# Patient Record
Sex: Female | Born: 1955 | Race: Black or African American | Hispanic: No | Smoking: Never smoker
Health system: Southern US, Community
[De-identification: ages and names within clinical notes are randomized; demographics above are authoritative.]

## PROBLEM LIST (undated history)

## (undated) DIAGNOSIS — E0500 Thyrotoxicosis with diffuse goiter without thyrotoxic crisis or storm: Secondary | ICD-10-CM

## (undated) DIAGNOSIS — D509 Iron deficiency anemia, unspecified: Secondary | ICD-10-CM

## (undated) DIAGNOSIS — D696 Thrombocytopenia, unspecified: Secondary | ICD-10-CM

## (undated) DIAGNOSIS — F419 Anxiety disorder, unspecified: Secondary | ICD-10-CM

## (undated) HISTORY — DX: Anxiety disorder, unspecified: F41.9

---

## 1990-01-08 HISTORY — PX: TUBAL LIGATION: SHX77

## 1999-08-23 ENCOUNTER — Emergency Department (HOSPITAL_COMMUNITY): Admission: EM | Admit: 1999-08-23 | Discharge: 1999-08-23 | Payer: Self-pay | Admitting: Emergency Medicine

## 2000-01-25 ENCOUNTER — Emergency Department (HOSPITAL_COMMUNITY): Admission: EM | Admit: 2000-01-25 | Discharge: 2000-01-25 | Payer: Self-pay | Admitting: Emergency Medicine

## 2001-01-08 HISTORY — PX: ENDOMETRIAL BIOPSY: SHX622

## 2001-07-28 ENCOUNTER — Encounter: Payer: Self-pay | Admitting: Obstetrics and Gynecology

## 2001-07-28 ENCOUNTER — Inpatient Hospital Stay (HOSPITAL_COMMUNITY): Admission: AD | Admit: 2001-07-28 | Discharge: 2001-07-28 | Payer: Self-pay | Admitting: *Deleted

## 2001-08-05 ENCOUNTER — Encounter (INDEPENDENT_AMBULATORY_CARE_PROVIDER_SITE_OTHER): Payer: Self-pay | Admitting: Specialist

## 2001-08-05 ENCOUNTER — Other Ambulatory Visit: Admission: RE | Admit: 2001-08-05 | Discharge: 2001-08-05 | Payer: Self-pay | Admitting: *Deleted

## 2001-08-05 ENCOUNTER — Encounter: Admission: RE | Admit: 2001-08-05 | Discharge: 2001-08-05 | Payer: Self-pay | Admitting: *Deleted

## 2001-11-19 ENCOUNTER — Emergency Department (HOSPITAL_COMMUNITY): Admission: EM | Admit: 2001-11-19 | Discharge: 2001-11-19 | Payer: Self-pay

## 2002-06-29 ENCOUNTER — Inpatient Hospital Stay (HOSPITAL_COMMUNITY): Admission: AD | Admit: 2002-06-29 | Discharge: 2002-06-29 | Payer: Self-pay | Admitting: Obstetrics & Gynecology

## 2006-02-23 ENCOUNTER — Emergency Department (HOSPITAL_COMMUNITY): Admission: EM | Admit: 2006-02-23 | Discharge: 2006-02-23 | Payer: Self-pay | Admitting: Emergency Medicine

## 2006-08-11 ENCOUNTER — Emergency Department (HOSPITAL_COMMUNITY): Admission: EM | Admit: 2006-08-11 | Discharge: 2006-08-11 | Payer: Self-pay | Admitting: Emergency Medicine

## 2006-08-11 IMAGING — CR DG CHEST 2V
2 series · 2 of 2 positions shown · non-contrast
Comparison: None.

CLINICAL DATA: Chest pain. Shortness of breath.

CHEST - 2 VIEW  [DATE]:

[w chest pa]
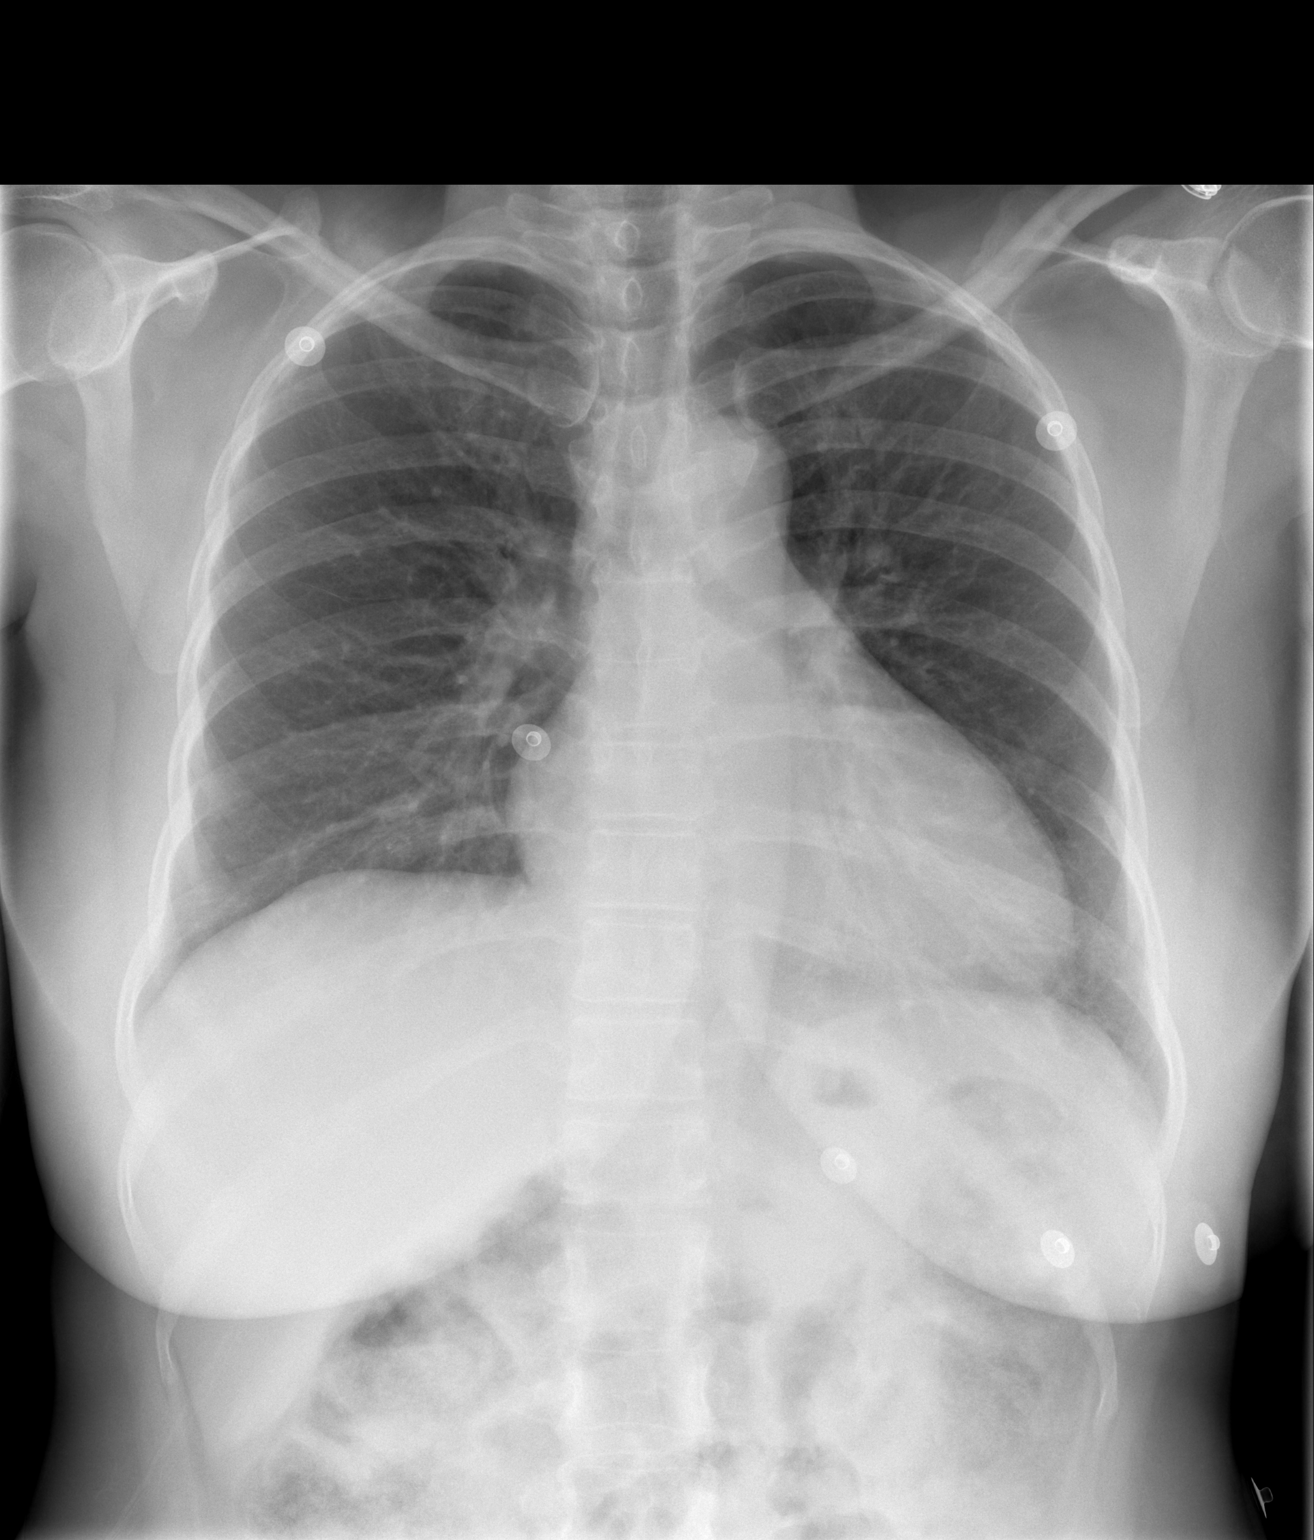

[w chest lat]
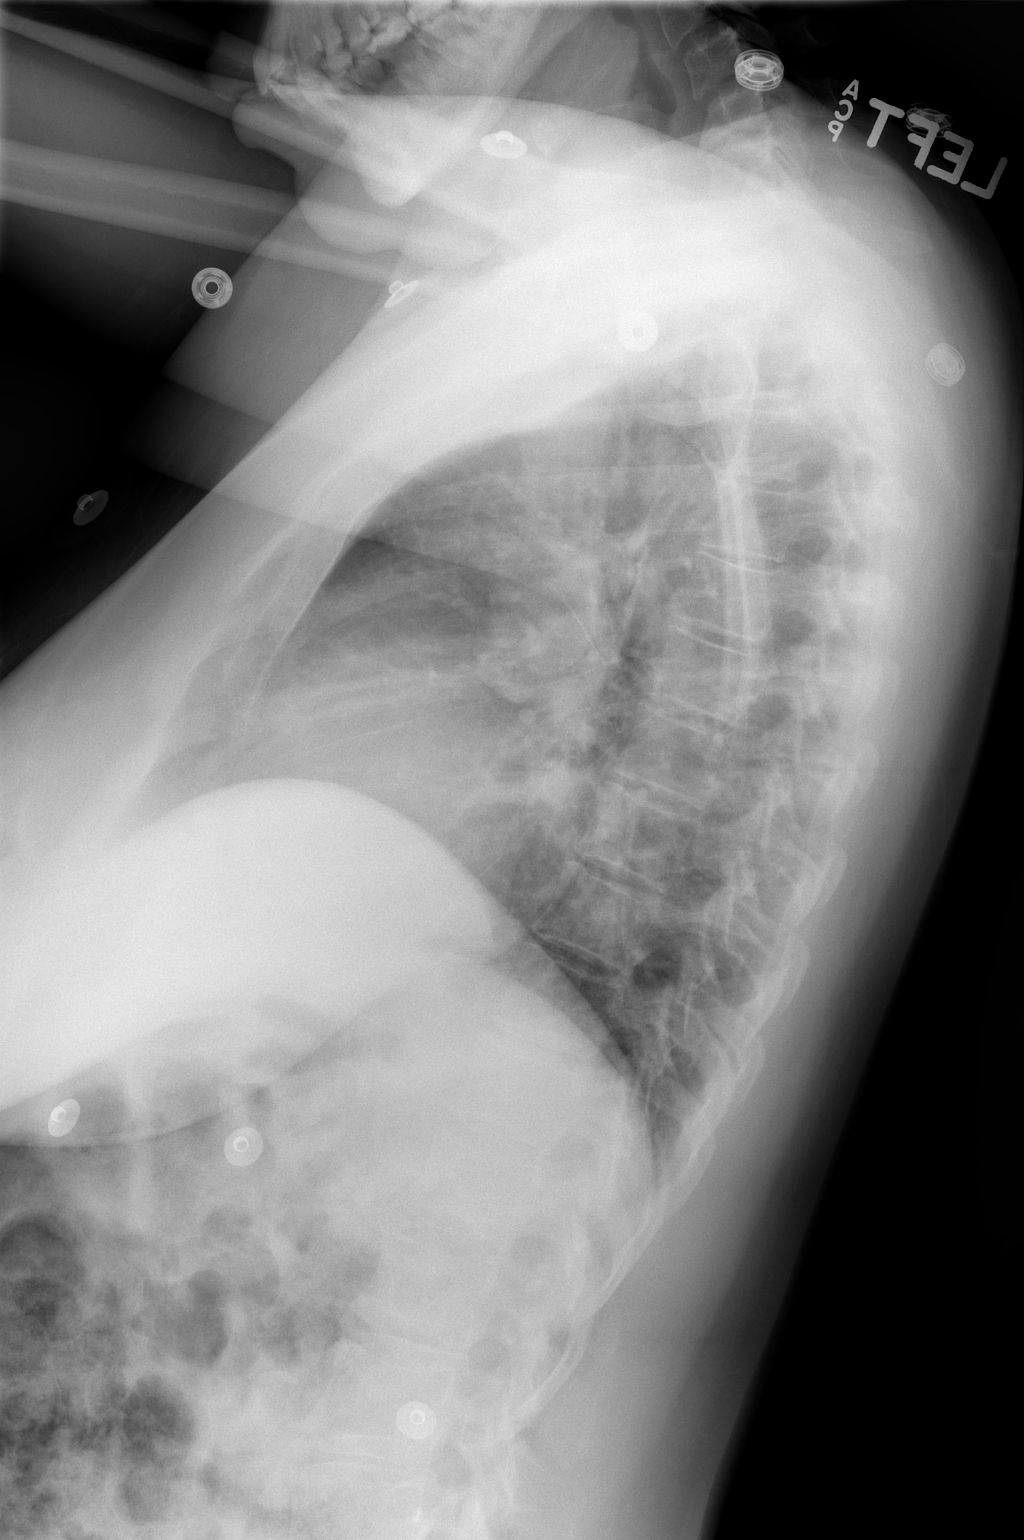

[2 of 2 positions shown; findings below may reference images not displayed]

FINDINGS: Heart mildly enlarged. Hilar and mediastinal contours otherwise
unremarkable. Pulmonary vascularity normal without evidence of pulmonary edema.
Lungs clear. No pleural effusions. Visualized bony thorax intact.
IMPRESSION: Mild cardiomegaly. No acute cardiopulmonary disease.

## 2006-11-23 ENCOUNTER — Emergency Department (HOSPITAL_COMMUNITY): Admission: EM | Admit: 2006-11-23 | Discharge: 2006-11-23 | Payer: Self-pay | Admitting: Emergency Medicine

## 2006-11-25 ENCOUNTER — Emergency Department (HOSPITAL_COMMUNITY): Admission: EM | Admit: 2006-11-25 | Discharge: 2006-11-25 | Payer: Self-pay | Admitting: Emergency Medicine

## 2006-12-07 ENCOUNTER — Emergency Department (HOSPITAL_COMMUNITY): Admission: EM | Admit: 2006-12-07 | Discharge: 2006-12-07 | Payer: Self-pay | Admitting: Emergency Medicine

## 2010-10-17 LAB — BASIC METABOLIC PANEL
BUN: 17
CO2: 25
Calcium: 8.8
Chloride: 110
Creatinine, Ser: 0.7
GFR calc Af Amer: >60
GFR calc non Af Amer: >60
Glucose, Bld: 83
Potassium: 3.7
Sodium: 141

## 2010-10-17 LAB — CBC
HCT: 37.3
Hemoglobin: 12.5
MCHC: 33.5
MCV: 79.8
Platelets: 164
RBC: 4.67
RDW: 15.1
WBC: 5.8

## 2010-10-17 LAB — DIFFERENTIAL
Basophils Absolute: 0
Eosinophils Relative: 5
Lymphocytes Relative: 23

## 2010-10-23 LAB — DIFFERENTIAL
Lymphocytes Relative: 40
Lymphs Abs: 1.7
Neutro Abs: 2.1
Neutrophils Relative %: 51

## 2010-10-23 LAB — CBC
HCT: 38.3
Platelets: 162
WBC: 4.2

## 2010-10-23 LAB — I-STAT 8, (EC8 V) (CONVERTED LAB)
BUN: 9
Bicarbonate: 30.1 — ABNORMAL HIGH
Glucose, Bld: 95
pCO2, Ven: 54 — ABNORMAL HIGH

## 2010-10-23 LAB — POCT CARDIAC MARKERS
CKMB, poc: 1.5
Myoglobin, poc: 54.7
Operator id: 133351
Troponin i, poc: <0.05

## 2010-10-23 LAB — D-DIMER, QUANTITATIVE: D-Dimer, Quant: 2.02 — ABNORMAL HIGH

## 2011-06-09 DIAGNOSIS — D696 Thrombocytopenia, unspecified: Secondary | ICD-10-CM

## 2011-06-09 HISTORY — DX: Thrombocytopenia, unspecified: D69.6

## 2011-09-09 DIAGNOSIS — D509 Iron deficiency anemia, unspecified: Secondary | ICD-10-CM

## 2011-09-09 HISTORY — DX: Iron deficiency anemia, unspecified: D50.9

## 2011-09-17 ENCOUNTER — Emergency Department (HOSPITAL_COMMUNITY): Payer: Self-pay

## 2011-09-17 ENCOUNTER — Encounter (HOSPITAL_COMMUNITY): Payer: Self-pay | Admitting: *Deleted

## 2011-09-17 DIAGNOSIS — N390 Urinary tract infection, site not specified: Secondary | ICD-10-CM | POA: Insufficient documentation

## 2011-09-17 DIAGNOSIS — R42 Dizziness and giddiness: Secondary | ICD-10-CM | POA: Insufficient documentation

## 2011-09-17 DIAGNOSIS — IMO0001 Reserved for inherently not codable concepts without codable children: Secondary | ICD-10-CM | POA: Insufficient documentation

## 2011-09-17 DIAGNOSIS — R21 Rash and other nonspecific skin eruption: Secondary | ICD-10-CM | POA: Insufficient documentation

## 2011-09-17 LAB — CBC WITH DIFFERENTIAL/PLATELET
Basophils Absolute: 0 10*3/uL (ref 0.0–0.1)
Eosinophils Absolute: 0 10*3/uL (ref 0.0–0.7)
Lymphs Abs: 2 10*3/uL (ref 0.7–4.0)
MCHC: 32.6 g/dL (ref 30.0–36.0)
MCV: 75.9 fL — ABNORMAL LOW (ref 78.0–100.0)
Monocytes Relative: 8 % (ref 3–12)
Neutro Abs: 4 10*3/uL (ref 1.7–7.7)
Platelets: ADEQUATE 10*3/uL (ref 150–400)
RDW: 13.7 % (ref 11.5–15.5)
WBC: 6.5 10*3/uL (ref 4.0–10.5)

## 2011-09-17 LAB — COMPREHENSIVE METABOLIC PANEL
ALT: 38 U/L — ABNORMAL HIGH (ref 0–35)
Albumin: 3.4 g/dL — ABNORMAL LOW (ref 3.5–5.2)
Alkaline Phosphatase: 139 U/L — ABNORMAL HIGH (ref 39–117)
BUN: 15 mg/dL (ref 6–23)
Chloride: 101 mEq/L (ref 96–112)
GFR calc Af Amer: >90 mL/min (ref >90)
Glucose, Bld: 108 mg/dL — ABNORMAL HIGH (ref 70–99)
Potassium: 3.5 mEq/L (ref 3.5–5.1)
Sodium: 137 mEq/L (ref 135–145)
Total Bilirubin: 0.3 mg/dL (ref 0.3–1.2)

## 2011-09-17 NOTE — ED Notes (Addendum)
C/o CP onset 1 week ago, mid chest, radiates to R breast and RUQ/flank, comes and goes, also reports sob, nausea, dizziness, weakness and intermitant weakness.Jamal Maes zantac on Sunday w/o relief. Also body aches.

## 2011-09-18 ENCOUNTER — Emergency Department (HOSPITAL_COMMUNITY)
Admission: EM | Admit: 2011-09-18 | Discharge: 2011-09-18 | Disposition: A | Discharge status: Home / Self Care | Payer: Self-pay | Attending: Emergency Medicine | Admitting: Emergency Medicine

## 2011-09-18 DIAGNOSIS — N390 Urinary tract infection, site not specified: Secondary | ICD-10-CM

## 2011-09-18 DIAGNOSIS — M791 Myalgia, unspecified site: Secondary | ICD-10-CM

## 2011-09-18 DIAGNOSIS — R21 Rash and other nonspecific skin eruption: Secondary | ICD-10-CM

## 2011-09-18 LAB — URINALYSIS, ROUTINE W REFLEX MICROSCOPIC
Bilirubin Urine: NEGATIVE
Glucose, UA: NEGATIVE mg/dL
Ketones, ur: NEGATIVE mg/dL
Protein, ur: NEGATIVE mg/dL
pH: 5.5 (ref 5.0–8.0)

## 2011-09-18 LAB — URINE MICROSCOPIC-ADD ON

## 2011-09-18 MED ORDER — SULFAMETHOXAZOLE-TRIMETHOPRIM 800-160 MG PO TABS: 1.0000 | ORAL_TABLET | Freq: Two times a day (BID) | ORAL | Status: AC

## 2011-09-18 MED ORDER — SODIUM CHLORIDE 0.9 % IV BOLUS (SEPSIS)
1000.0000 mL | Freq: Once | INTRAVENOUS | Status: AC
  Administered 2011-09-18: 1000 mL via INTRAVENOUS

## 2011-09-18 MED ORDER — PREDNISONE 20 MG PO TABS: 40.0000 mg | ORAL_TABLET | Freq: Every day | ORAL | Status: AC

## 2011-09-18 MED ORDER — KETOROLAC TROMETHAMINE 30 MG/ML IJ SOLN
INTRAMUSCULAR | Status: AC
  Filled 2011-09-18: qty 1

## 2011-09-18 MED ORDER — METHYLPREDNISOLONE SODIUM SUCC 125 MG IJ SOLR
125.0000 mg | Freq: Once | INTRAMUSCULAR | Status: AC
  Administered 2011-09-18: 125 mg via INTRAVENOUS
  Filled 2011-09-18: qty 2

## 2011-09-18 MED ORDER — NAPROXEN 500 MG PO TABS: 500.0000 mg | ORAL_TABLET | Freq: Two times a day (BID) | ORAL | Status: DC

## 2011-09-18 MED ORDER — KETOROLAC TROMETHAMINE 30 MG/ML IJ SOLN
30.0000 mg | Freq: Once | INTRAMUSCULAR | Status: AC
  Administered 2011-09-18: 30 mg via INTRAVENOUS
  Filled 2011-09-18: qty 1

## 2011-09-18 NOTE — ED Notes (Signed)
Redness and itching improved to rash.  Pt states toradol improve chest pain and reduce her pain to 4/10.

## 2011-09-18 NOTE — ED Provider Notes (Signed)
History     CSN: [id number]  Arrival date & time 09/17/11  1906   First MD Initiated Contact with Patient 09/18/11 0102      Chief Complaint  Patient presents with  . Chest Pain  . Dizziness    (Consider location/radiation/quality/duration/timing/severity/associated sxs/prior treatment) HPI Comments: 56 year old female with no significant past medical history presents with approximately one week of symptoms. The symptoms are multiple and are as follows  #1 rash approximately one week ago the patient developed a bilateral antecubital papular eruptions along with a papular eruption around the neck and up the hairline posteriorly. This is papular and has responded minimally to hydrocortisone. There is no petechiae or purpura, no lesions in her mouth and she has not recently started any new medications.  #2 headache this is bilateral temples, she does have a history of headaches similar to this, she has no changes in her vision, no numbness weakness or ataxia. This has been present for 5 days, Advil makes the headache go away but it does come back later.  #3 back pain and chest pain, patient states that she over the last several months has had an intermittent shooting pain that goes from her right upper back to her right chest and then totally resolved, lasts only seconds and is not related with shortness of breath coughing fevers chills nausea or vomiting. She notes that over the last several days she's developed severe generalized fatigue and bilateral sore legs. She has difficulty walking or even standing because of the pain in her muscles. She denies any swelling of her joints, changes in vision and has no focal weakness. She does not have a family Dr.  Patient is a 56 y.o. female presenting with chest pain. The history is provided by the patient and the spouse.  Chest Pain     History reviewed. No pertinent past medical history.  History reviewed. No pertinent past surgical  history.  No family history on file.  History  Substance Use Topics  . Smoking status: Never Smoker   . Smokeless tobacco: Not on file  . Alcohol Use: No    OB History    Grav Para Term Preterm Abortions TAB SAB Ect Mult Living                  Review of Systems  Cardiovascular: Positive for chest pain.  All other systems reviewed and are negative.    Allergies  Review of patient's allergies indicates no known allergies.  Home Medications   Current Outpatient Rx  Name Route Sig Dispense Refill  . NAPROXEN 500 MG PO TABS Oral Take 1 tablet (500 mg total) by mouth 2 (two) times daily with a meal. 30 tablet 0  . PREDNISONE 20 MG PO TABS Oral Take 2 tablets (40 mg total) by mouth daily. Take 40 mg by mouth daily for 3 days, then 20mg  by mouth daily for 3 days, then 10mg  daily for 3 days 12 tablet 0  . SULFAMETHOXAZOLE-TRIMETHOPRIM 800-160 MG PO TABS Oral Take 1 tablet by mouth every 12 (twelve) hours. 20 tablet 0    BP 112/69  Pulse 87  Temp 98.6 F (37 C) (Oral)  Resp 18  SpO2 97%  Physical Exam  Nursing note and vitals reviewed. Constitutional: She appears well-developed and well-nourished. No distress.  HENT:  Head: Normocephalic and atraumatic.  Mouth/Throat: Oropharynx is clear and moist. No oropharyngeal exudate.       No oropharyngeal lesions  Eyes: Conjunctivae and EOM are  normal. Pupils are equal, round, and reactive to light. Right eye exhibits no discharge. Left eye exhibits no discharge. No scleral icterus.  Neck: Normal range of motion. Neck supple. No JVD present. No thyromegaly present.  Cardiovascular: Normal rate, regular rhythm, normal heart sounds and intact distal pulses.  Exam reveals no gallop and no friction rub.   No murmur heard. Pulmonary/Chest: Effort normal and breath sounds normal. No respiratory distress. She has no wheezes. She has no rales.  Abdominal: Soft. Bowel sounds are normal. She exhibits no distension and no mass. There is  tenderness ( Mild suprapubic tenderness, no guarding or masses, no pain at McBurney's point).  Musculoskeletal: Normal range of motion. She exhibits tenderness ( Diffuse muscular tenderness of the lower extremities including quadriceps, hamstrings, calf muscles.). She exhibits no edema.       No effusions of the joints, normal range of motion of all major joints  Lymphadenopathy:    She has no cervical adenopathy.  Neurological: She is alert. Coordination normal.  Skin: Skin is warm and dry. Rash ( Papular eruption of the bilateral antecubital fossa as well as around the neck and up the scalp posteriorly to the hairline.) noted. No erythema.       No petechiae or purpura  Psychiatric: She has a normal mood and affect. Her behavior is normal.    ED Course  Procedures (including critical care time)  Labs Reviewed  COMPREHENSIVE METABOLIC PANEL - Abnormal; Notable for the following:    Glucose, Bld 108 (*)     Albumin 3.4 (*)     ALT 38 (*)     Alkaline Phosphatase 139 (*)     All other components within normal limits  CBC WITH DIFFERENTIAL - Abnormal; Notable for the following:    MCV 75.9 (*)     MCH 24.7 (*)     All other components within normal limits  SEDIMENTATION RATE - Abnormal; Notable for the following:    Sed Rate 45 (*)     All other components within normal limits  URINALYSIS, ROUTINE W REFLEX MICROSCOPIC - Abnormal; Notable for the following:    APPearance CLOUDY (*)     Hgb urine dipstick TRACE (*)     Nitrite POSITIVE (*)     Leukocytes, UA SMALL (*)     All other components within normal limits  URINE MICROSCOPIC-ADD ON - Abnormal; Notable for the following:    Squamous Epithelial / LPF FEW (*)     Bacteria, UA MANY (*)     All other components within normal limits  LIPASE, BLOOD  CK  URINE CULTURE   Dg Chest 2 View  09/17/2011  *RADIOLOGY REPORT*  Clinical Data: Chest pain and dizziness.  CHEST - 2 VIEW  Comparison: Chest radiographs and chest CTA dated  08/11/2006.  Findings: Mildly progressive enlargement of the cardiac silhouette. Mild increase in prominence of the interstitial markings.  The vascularity remains within normal limits.  No pleural fluid. Unremarkable bones.  IMPRESSION: Mildly progressive cardiomegaly with interval mild chronic interstitial lung disease.   Original Report Authenticated By: Darrol Angel, M.D.      1. Myalgia   2. UTI (lower urinary tract infection)   3. Rash       MDM  The patient has generalized symptoms, possibly consistent with an arthritic a, EKG shows a sinus tachycardia without any significant ischemic or abnormal findings other than rate. There is no old EKG with which to compare and the patient has  no cardiac history. She is not currently having any chest pain or back pain. We'll pursue workup for her rash and muscular pain with sedimentation rate, CK, urinalysis for the lower abdominal pain, steroids and pain medications given, reevaluate.  ED ECG REPORT  I personally interpreted this EKG   Date: 09/18/2011   Rate: 114  Rhythm: sinus tachycardia  QRS Axis: normal  Intervals: normal  ST/T Wave abnormalities: nonspecific T wave changes  Conduction Disutrbances:none  Narrative Interpretation:   Old EKG Reviewed: none available   The patient states she feels much much better, she has a negative creatine kinase, elevated sedimentation rate consistent with an inflammatory process and a urinary tract infection. She has been given Toradol, Solu-Medrol and normal saline and has had significant improvement in her symptoms and that her pain is now 3/10. At this time she does appear stable for discharge, her urinalysis does reveal urinary infection, Bactrim has been prescribed and a urine culture ordered given the fact we're putting her on prednisone.  Vida Roller, MD 09/18/11 [id number]

## 2011-09-19 LAB — URINE CULTURE

## 2012-06-29 ENCOUNTER — Emergency Department (HOSPITAL_COMMUNITY)
Admission: EM | Admit: 2012-06-29 | Discharge: 2012-06-29 | Disposition: A | Discharge status: Home / Self Care | Payer: Self-pay | Attending: Emergency Medicine | Admitting: Emergency Medicine

## 2012-06-29 ENCOUNTER — Encounter (HOSPITAL_COMMUNITY): Payer: Self-pay | Admitting: Family Medicine

## 2012-06-29 ENCOUNTER — Emergency Department (HOSPITAL_COMMUNITY): Payer: Self-pay

## 2012-06-29 DIAGNOSIS — R0602 Shortness of breath: Secondary | ICD-10-CM | POA: Insufficient documentation

## 2012-06-29 DIAGNOSIS — R079 Chest pain, unspecified: Secondary | ICD-10-CM

## 2012-06-29 DIAGNOSIS — R05 Cough: Secondary | ICD-10-CM | POA: Insufficient documentation

## 2012-06-29 DIAGNOSIS — R059 Cough, unspecified: Secondary | ICD-10-CM | POA: Insufficient documentation

## 2012-06-29 LAB — POCT I-STAT TROPONIN I
Troponin i, poc: 0.01 ng/mL (ref 0.00–0.08)
Troponin i, poc: 0 ng/mL (ref 0.00–0.08)

## 2012-06-29 LAB — CBC WITH DIFFERENTIAL/PLATELET
Basophils Absolute: 0 10*3/uL (ref 0.0–0.1)
Hemoglobin: 11.3 g/dL — ABNORMAL LOW (ref 12.0–15.0)
Lymphocytes Relative: 38 % (ref 12–46)
MCH: 23.5 pg — ABNORMAL LOW (ref 26.0–34.0)
MCHC: 32.8 g/dL (ref 30.0–36.0)
MCV: 71.9 fL — ABNORMAL LOW (ref 78.0–100.0)
Monocytes Absolute: 0.5 10*3/uL (ref 0.1–1.0)
Neutro Abs: 2.3 10*3/uL (ref 1.7–7.7)
Neutrophils Relative %: 51 % (ref 43–77)
RBC: 4.8 MIL/uL (ref 3.87–5.11)

## 2012-06-29 LAB — BASIC METABOLIC PANEL
CO2: 26 mEq/L (ref 19–32)
Chloride: 106 mEq/L (ref 96–112)
Sodium: 140 mEq/L (ref 135–145)

## 2012-06-29 MED ORDER — ASPIRIN 81 MG PO CHEW
324.0000 mg | CHEWABLE_TABLET | Freq: Once | ORAL | Status: AC
  Administered 2012-06-29: 324 mg via ORAL
  Filled 2012-06-29: qty 4

## 2012-06-29 NOTE — ED Notes (Signed)
Per pt chest pain while at work today. sts more when breathing and moving sts also shoulder pain. sts she has had productive cough. sts weight loss in the past few months. Denies injury or heavy lifting.

## 2012-06-29 NOTE — ED Notes (Signed)
Patient transported to X-ray 

## 2012-06-29 NOTE — ED Provider Notes (Signed)
History     CSN: [id number]  Arrival date & time 06/29/12  1149   First MD Initiated Contact with Patient 06/29/12 1158      Chief Complaint  Patient presents with  . Chest Pain    (Consider location/radiation/quality/duration/timing/severity/associated sxs/prior treatment) HPI Comments: Patient presents with a chief complaint of chest pain.  She reports that she had an episode of chest pain at 8 AM this morning.  Chest pain has been intermittent over the past 2 weeks.  She reports that the pain comes on both with exertion and with rest.  The pain typically last for a couple of minutes and then resolves.  She states that the pain is typically a sharp pain.  The pain does occasionally radiate to her right arm.  She reports that nothing makes the pain better or worse.  She also reports that she does have some SOB with the chest pain.  She denies any chest pain or SOB at this time.  She denies any prior cardiac history.  Denies history of HTN, Hyperlipidemia, or DM.  She does not smoke.  She does not have a PCP or Cardiologist.  She denies any prolonged travel or surgery in the past 4 weeks.  Denies prior history of DVT or PE.  Denies being on any estrogen containing medications.  Denies lower extremity pain or swelling.  No history of Cancer.  She denies hemoptysis.  She does however, report that she has had a cough for the past month.  She denies nausea, vomiting, dizziness, lightheadedness, syncope, fever, or chills.    The history is provided by the patient.    History reviewed. No pertinent past medical history.  History reviewed. No pertinent past surgical history.  History reviewed. No pertinent family history.  History  Substance Use Topics  . Smoking status: Never Smoker   . Smokeless tobacco: Not on file  . Alcohol Use: No    OB History   Grav Para Term Preterm Abortions TAB SAB Ect Mult Living                  Review of Systems  All other systems reviewed and are  negative.    Allergies  Review of patient's allergies indicates no known allergies.  Home Medications   Current Outpatient Rx  Name  Route  Sig  Dispense  Refill  . naproxen (NAPROSYN) 500 MG tablet   Oral   Take 1 tablet (500 mg total) by mouth 2 (two) times daily with a meal.   30 tablet   0     BP 109/59  Pulse 93  Temp(Src) 98.6 F (37 C) (Oral)  Resp 18  SpO2 100%  Physical Exam  Nursing note and vitals reviewed. Constitutional: She appears well-developed and well-nourished. No distress.  HENT:  Head: Normocephalic and atraumatic.  Neck: Normal range of motion. Neck supple.  Cardiovascular: Normal rate, regular rhythm, normal heart sounds and intact distal pulses.   Pulses:      Dorsalis pedis pulses are 2+ on the right side, and 2+ on the left side.  Pulmonary/Chest: Effort normal and breath sounds normal. No respiratory distress. She has no wheezes. She has no rales.  Abdominal: Soft. Bowel sounds are normal. She exhibits no distension and no mass. There is no tenderness. There is no rebound and no guarding.  Musculoskeletal:  No LE edema or erythema  Neurological: She is alert.  Skin: Skin is warm and dry. She is not diaphoretic.  Psychiatric:  She has a normal mood and affect.    ED Course  Procedures (including critical care time)  Labs Reviewed - No data to display No results found.   No diagnosis found.   Date: 06/29/2012  Rate: 95  Rhythm: normal sinus rhythm  QRS Axis: normal  Intervals: normal  ST/T Wave abnormalities: nonspecific ST changes, nonspecific T wave changes and early repolarization  Conduction Disutrbances:none  Narrative Interpretation: LVH  Old EKG Reviewed: LVH not present on previous EKG    Patient discussed with Dr. Rubin Payor.  2:30 PM Discussed with Dr. Mayford Knife.  She reports that patient will be able to be seen in the Cardiology office on Monday.    MDM  Patient is to be discharged with recommendation to follow up  with Cardiology tomorrow in regards to today's hospital visit.  Patient does not have any risk factors for PE aside from age.   VSS, no tracheal deviation, no JVD or new murmur, Heart RRR, breath sounds equal bilaterally, EKG without acute abnormalities, negative initial and 3 hour troponin, and negative CXR.  Patient has remained chest pain free during her course in the ED.  Patient's TIMI score is 1.  Pt appears reliable for follow up and is agreeable to discharge.  Return precautions given.  Case has been discussed with Dr. Rubin Payor who agrees with the above plan to discharge.         Pascal [place], PA-C 06/29/12 2115

## 2012-06-30 NOTE — ED Provider Notes (Signed)
Medical screening examination/treatment/procedure(s) were performed by non-physician practitioner and as supervising physician I was immediately available for consultation/collaboration.  Juliet Rude. Rubin Payor, MD 06/30/12 2106

## 2012-11-05 ENCOUNTER — Encounter (HOSPITAL_COMMUNITY): Payer: Self-pay | Admitting: Emergency Medicine

## 2012-11-05 DIAGNOSIS — R079 Chest pain, unspecified: Secondary | ICD-10-CM | POA: Insufficient documentation

## 2012-11-05 LAB — COMPREHENSIVE METABOLIC PANEL
AST: 30 U/L (ref 0–37)
Albumin: 3.2 g/dL — ABNORMAL LOW (ref 3.5–5.2)
Chloride: 106 mEq/L (ref 96–112)
Creatinine, Ser: 0.35 mg/dL — ABNORMAL LOW (ref 0.50–1.10)
Potassium: 3.7 mEq/L (ref 3.5–5.1)
Total Bilirubin: 0.3 mg/dL (ref 0.3–1.2)

## 2012-11-05 LAB — CBC WITH DIFFERENTIAL/PLATELET
Basophils Absolute: 0 10*3/uL (ref 0.0–0.1)
Lymphocytes Relative: 31 % (ref 12–46)
Lymphs Abs: 2 10*3/uL (ref 0.7–4.0)
Monocytes Relative: 8 % (ref 3–12)
Platelets: 99 10*3/uL — ABNORMAL LOW (ref 150–400)
RDW: 14.5 % (ref 11.5–15.5)
WBC: 6.3 10*3/uL (ref 4.0–10.5)

## 2012-11-05 NOTE — ED Notes (Signed)
The pt is c/o mid upper chest pain since yesterday.  Worse today.  Dizziness and some sob.  None now..  No recent cough or cold

## 2012-11-06 ENCOUNTER — Emergency Department (HOSPITAL_COMMUNITY)
Admission: EM | Admit: 2012-11-06 | Discharge: 2012-11-06 | Discharge status: Against Medical Advice | Payer: PRIVATE HEALTH INSURANCE | Attending: Emergency Medicine | Admitting: Emergency Medicine

## 2012-11-06 NOTE — ED Notes (Signed)
Patient was called x1 to be roomed and no answer.

## 2013-01-08 DIAGNOSIS — E0500 Thyrotoxicosis with diffuse goiter without thyrotoxic crisis or storm: Secondary | ICD-10-CM

## 2013-01-08 HISTORY — DX: Thyrotoxicosis with diffuse goiter without thyrotoxic crisis or storm: E05.00

## 2013-01-15 ENCOUNTER — Encounter (HOSPITAL_COMMUNITY): Payer: Self-pay | Admitting: Emergency Medicine

## 2013-01-15 ENCOUNTER — Emergency Department (HOSPITAL_COMMUNITY)
Admission: EM | Admit: 2013-01-15 | Discharge: 2013-01-15 | Disposition: A | Discharge status: Home / Self Care | Payer: PRIVATE HEALTH INSURANCE | Attending: Emergency Medicine | Admitting: Emergency Medicine

## 2013-01-15 DIAGNOSIS — H6690 Otitis media, unspecified, unspecified ear: Secondary | ICD-10-CM | POA: Insufficient documentation

## 2013-01-15 DIAGNOSIS — H6691 Otitis media, unspecified, right ear: Secondary | ICD-10-CM

## 2013-01-15 DIAGNOSIS — E119 Type 2 diabetes mellitus without complications: Secondary | ICD-10-CM | POA: Insufficient documentation

## 2013-01-15 DIAGNOSIS — I10 Essential (primary) hypertension: Secondary | ICD-10-CM | POA: Insufficient documentation

## 2013-01-15 DIAGNOSIS — E079 Disorder of thyroid, unspecified: Secondary | ICD-10-CM | POA: Insufficient documentation

## 2013-01-15 MED ORDER — ANTIPYRINE-BENZOCAINE 5.4-1.4 % OT SOLN
3.0000 [drp] | OTIC | Status: DC | PRN
  Administered 2013-01-15: 4 [drp] via OTIC
  Filled 2013-01-15: qty 10

## 2013-01-15 MED ORDER — AMOXICILLIN 500 MG PO CAPS: 500.0000 mg | ORAL_CAPSULE | Freq: Three times a day (TID) | ORAL | Status: DC

## 2013-01-15 MED ORDER — AMOXICILLIN 500 MG PO CAPS
500.0000 mg | ORAL_CAPSULE | Freq: Once | ORAL | Status: AC
  Administered 2013-01-15: 500 mg via ORAL
  Filled 2013-01-15: qty 1

## 2013-01-15 NOTE — ED Notes (Signed)
Pt reports pain on r/inner ear since this am

## 2013-01-15 NOTE — Discharge Instructions (Signed)

## 2013-01-15 NOTE — ED Provider Notes (Signed)
CSN: [id number]     Arrival date & time 01/15/13  1221 History  This chart was scribed for non-physician practitioner, Cherrie Distance, PA-C working with Raeford Razor, MD by Luisa Dago, ED scribe. This patient was seen in room WTR6/WTR6 and the patient's care was started at 12:38 PM.   No chief complaint on file.   The history is provided by the patient. No language interpreter was used.   HPI Comments: Julie Ware is a 58 y.o. female who presents to the Emergency Department complaining of sharp stabbing right ear pain that started 2 days ago. Pt denies taking any OTC medication to relieve her symptoms. She denies any drainage, sore throat, fever chills, cough, or congestion.    Past Medical History  Diagnosis Date  . Hypertension   . Diabetes mellitus without complication    No past surgical history on file. No family history on file. History  Substance Use Topics  . Smoking status: Never Smoker   . Smokeless tobacco: Not on file  . Alcohol Use: No   OB History   Grav Para Term Preterm Abortions TAB SAB Ect Mult Living                 Review of Systems  All other systems reviewed and are negative.    Allergies  Review of patient's allergies indicates no known allergies.  Home Medications   Current Outpatient Rx  Name  Route  Sig  Dispense  Refill  . ibuprofen (ADVIL,MOTRIN) 200 MG tablet   Oral   Take 800 mg by mouth every 6 (six) hours as needed for pain.          BP 107/56  Pulse 97  Temp(Src) 98.7 F (37.1 C) (Oral)  Resp 18  SpO2 99%  Physical Exam  Nursing note and vitals reviewed. Constitutional: She is oriented to person, place, and time. She appears well-developed and well-nourished. No distress.  HENT:  Head: Normocephalic and atraumatic.  Right Ear: Hearing and ear canal normal. No drainage or swelling. No mastoid tenderness. Tympanic membrane is erythematous and bulging. A middle ear effusion is present.  Left Ear: Hearing and ear  canal normal. No drainage or swelling. No mastoid tenderness. Tympanic membrane is not erythematous.  No middle ear effusion.  Nose: Nose normal.  Mouth/Throat: Oropharynx is clear and moist. No oropharyngeal exudate.  Eyes: Conjunctivae are normal. Pupils are equal, round, and reactive to light. No scleral icterus.  Neck: Normal range of motion. Neck supple.  Cardiovascular: Normal rate, regular rhythm and normal heart sounds.  Exam reveals no gallop and no friction rub.   No murmur heard. Pulmonary/Chest: Effort normal and breath sounds normal. No respiratory distress. She has no wheezes. She has no rales. She exhibits no tenderness.  Musculoskeletal: Normal range of motion. She exhibits no edema and no tenderness.  Lymphadenopathy:    She has no cervical adenopathy.  Neurological: She is alert and oriented to person, place, and time. No cranial nerve deficit. She exhibits normal muscle tone. Coordination normal.  Skin: Skin is warm and dry. No rash noted. No erythema. No pallor.  Psychiatric: She has a normal mood and affect. Her behavior is normal. Judgment and thought content normal.    ED Course  Procedures (including critical care time)  DIAGNOSTIC STUDIES: Oxygen Saturation is 99% on RA, normal by my interpretation.    COORDINATION OF CARE: 12:41 PM- Will prescribe antibiotics.Pt advised of plan for treatment and pt agrees. Medications  amoxicillin (AMOXIL) capsule  500 mg (not administered)  antipyrine-benzocaine (AURALGAN) otic solution 3-4 drop (not administered)     Labs Review Labs Reviewed - No data to display Imaging Review No results found.  EKG Interpretation   None       MDM  Right OM  Patient here with right ear pain x 1 day - mid ear effusion and erythema noted to TM, no ttp mastoid process, no TMJ pain or click noted, dentition good without trismus.  I personally performed the services described in this documentation, which was scribed in my presence.  The recorded information has been reviewed and is accurate.    Izola PriceFrances C. Marisue HumbleSanford, PA-C 01/15/13 1257

## 2013-01-16 NOTE — ED Provider Notes (Signed)
Medical screening examination/treatment/procedure(s) were performed by non-physician practitioner and as supervising physician I was immediately available for consultation/collaboration.  EKG Interpretation   None        Evangaline Jou, MD 01/16/13 1543 

## 2013-01-18 ENCOUNTER — Emergency Department (HOSPITAL_COMMUNITY)
Admission: EM | Admit: 2013-01-18 | Discharge: 2013-01-18 | Disposition: A | Discharge status: Home / Self Care | Payer: PRIVATE HEALTH INSURANCE | Attending: Emergency Medicine | Admitting: Emergency Medicine

## 2013-01-18 ENCOUNTER — Encounter (HOSPITAL_COMMUNITY): Payer: Self-pay | Admitting: Emergency Medicine

## 2013-01-18 DIAGNOSIS — R6883 Chills (without fever): Secondary | ICD-10-CM | POA: Insufficient documentation

## 2013-01-18 DIAGNOSIS — M549 Dorsalgia, unspecified: Secondary | ICD-10-CM | POA: Insufficient documentation

## 2013-01-18 DIAGNOSIS — H5789 Other specified disorders of eye and adnexa: Secondary | ICD-10-CM | POA: Insufficient documentation

## 2013-01-18 DIAGNOSIS — R350 Frequency of micturition: Secondary | ICD-10-CM | POA: Insufficient documentation

## 2013-01-18 DIAGNOSIS — R3915 Urgency of urination: Secondary | ICD-10-CM | POA: Insufficient documentation

## 2013-01-18 DIAGNOSIS — R0602 Shortness of breath: Secondary | ICD-10-CM | POA: Insufficient documentation

## 2013-01-18 DIAGNOSIS — R109 Unspecified abdominal pain: Secondary | ICD-10-CM | POA: Insufficient documentation

## 2013-01-18 DIAGNOSIS — R112 Nausea with vomiting, unspecified: Secondary | ICD-10-CM | POA: Insufficient documentation

## 2013-01-18 DIAGNOSIS — E079 Disorder of thyroid, unspecified: Secondary | ICD-10-CM | POA: Insufficient documentation

## 2013-01-18 DIAGNOSIS — R197 Diarrhea, unspecified: Secondary | ICD-10-CM | POA: Insufficient documentation

## 2013-01-18 DIAGNOSIS — R300 Dysuria: Secondary | ICD-10-CM | POA: Insufficient documentation

## 2013-01-18 LAB — CBC WITH DIFFERENTIAL/PLATELET
BASOS PCT: 0 % (ref 0–1)
Basophils Absolute: 0 10*3/uL (ref 0.0–0.1)
EOS ABS: 0 10*3/uL (ref 0.0–0.7)
EOS PCT: 0 % (ref 0–5)
HCT: 37 % (ref 36.0–46.0)
HEMOGLOBIN: 12.5 g/dL (ref 12.0–15.0)
LYMPHS PCT: 20 % (ref 12–46)
Lymphs Abs: 1.3 10*3/uL (ref 0.7–4.0)
MCH: 23.2 pg — ABNORMAL LOW (ref 26.0–34.0)
MCHC: 33.8 g/dL (ref 30.0–36.0)
MCV: 68.8 fL — ABNORMAL LOW (ref 78.0–100.0)
MONOS PCT: 7 % (ref 3–12)
Monocytes Absolute: 0.4 10*3/uL (ref 0.1–1.0)
NEUTROS PCT: 73 % (ref 43–77)
Neutro Abs: 4.7 10*3/uL (ref 1.7–7.7)
Platelets: 134 10*3/uL — ABNORMAL LOW (ref 150–400)
RBC: 5.38 MIL/uL — AB (ref 3.87–5.11)
RDW: 15.3 % (ref 11.5–15.5)
WBC: 6.4 10*3/uL (ref 4.0–10.5)

## 2013-01-18 LAB — COMPREHENSIVE METABOLIC PANEL
ALBUMIN: 3.7 g/dL (ref 3.5–5.2)
ALT: 26 U/L (ref 0–35)
AST: 34 U/L (ref 0–37)
Alkaline Phosphatase: 231 U/L — ABNORMAL HIGH (ref 39–117)
BUN: 17 mg/dL (ref 6–23)
CO2: 22 mEq/L (ref 19–32)
CREATININE: 0.31 mg/dL — AB (ref 0.50–1.10)
Calcium: 10 mg/dL (ref 8.4–10.5)
Chloride: 100 mEq/L (ref 96–112)
GFR calc Af Amer: >90 mL/min (ref >90)
GFR calc non Af Amer: >90 mL/min (ref >90)
Glucose, Bld: 64 mg/dL — ABNORMAL LOW (ref 70–99)
POTASSIUM: 3.8 meq/L (ref 3.7–5.3)
Sodium: 138 mEq/L (ref 137–147)
TOTAL PROTEIN: 8.1 g/dL (ref 6.0–8.3)
Total Bilirubin: 0.8 mg/dL (ref 0.3–1.2)

## 2013-01-18 LAB — GLUCOSE, CAPILLARY: GLUCOSE-CAPILLARY: 81 mg/dL (ref 70–99)

## 2013-01-18 LAB — URINALYSIS, ROUTINE W REFLEX MICROSCOPIC
Bilirubin Urine: NEGATIVE
GLUCOSE, UA: NEGATIVE mg/dL
Hgb urine dipstick: NEGATIVE
Ketones, ur: NEGATIVE mg/dL
LEUKOCYTES UA: NEGATIVE
NITRITE: NEGATIVE
PH: 5 (ref 5.0–8.0)
Protein, ur: NEGATIVE mg/dL
SPECIFIC GRAVITY, URINE: 1.014 (ref 1.005–1.030)
Urobilinogen, UA: 0.2 mg/dL (ref 0.0–1.0)

## 2013-01-18 LAB — LIPASE, BLOOD: LIPASE: 15 U/L (ref 11–59)

## 2013-01-18 MED ORDER — SODIUM CHLORIDE 0.9 % IV BOLUS (SEPSIS)
1000.0000 mL | Freq: Once | INTRAVENOUS | Status: AC
  Administered 2013-01-18: 1000 mL via INTRAVENOUS

## 2013-01-18 MED ORDER — MORPHINE SULFATE 2 MG/ML IJ SOLN
2.0000 mg | Freq: Once | INTRAMUSCULAR | Status: AC
  Administered 2013-01-18: 2 mg via INTRAVENOUS
  Filled 2013-01-18: qty 1

## 2013-01-18 MED ORDER — ONDANSETRON 4 MG PO TBDP: ORAL_TABLET | ORAL | Status: DC

## 2013-01-18 MED ORDER — ONDANSETRON HCL 4 MG/2ML IJ SOLN
4.0000 mg | Freq: Once | INTRAMUSCULAR | Status: AC
  Administered 2013-01-18: 4 mg via INTRAVENOUS
  Filled 2013-01-18: qty 2

## 2013-01-18 MED ORDER — ONDANSETRON 4 MG PO TBDP
4.0000 mg | ORAL_TABLET | Freq: Once | ORAL | Status: AC
  Administered 2013-01-18: 4 mg via ORAL
  Filled 2013-01-18: qty 1

## 2013-01-18 NOTE — ED Notes (Signed)
Ginger ale and OJ given per PA

## 2013-01-18 NOTE — ED Provider Notes (Signed)
CSN: [id number]     Arrival date & time 01/18/13  0559 History   First MD Initiated Contact with Patient 01/18/13 [phone]     Chief Complaint  Patient presents with  . Diarrhea  . Emesis   (Consider location/radiation/quality/duration/timing/severity/associated sxs/prior Treatment) Patient is a 58 y.o. female presenting with diarrhea and vomiting.  Diarrhea Associated symptoms: chills and vomiting   Emesis Associated symptoms: chills and diarrhea    58 yo small thin female presents with 2 day hx of N/V/D with associated abdominal pain. Patient reports having 5-6 episodes of vomiting with multiple unspecified amount of episodes of diarrhea. Patient reports 8/10 abdominal pain described as crampy and constant. Does not radiate and is noted to be generalized. Patient has not tried any medications. Denies sick contacts. Admits to dysuria x 1 month with associated LBP. Denies hematuria. Denies fever or bloody stools. Denies alcohol or drug use.  PMH significant for Hyperthyroid that is not currently being treated, in addition to previous C-section.  Past Medical History  Diagnosis Date  . Thyroid disease    Past Surgical History  Procedure Laterality Date  . Cesarean section     Family History  Problem Relation Age of Onset  . Diabetes Mother   . Hypertension Mother    History  Substance Use Topics  . Smoking status: Never Smoker   . Smokeless tobacco: Not on file  . Alcohol Use: No   OB History   Grav Para Term Preterm Abortions TAB SAB Ect Mult Living                 Review of Systems  Constitutional: Positive for chills.  HENT: Negative for congestion and ear pain.   Eyes: Positive for redness (Uses daily Visine (long term use) ). Negative for visual disturbance.  Respiratory: Positive for shortness of breath (Chronic x 1 year (unchanged)).   Cardiovascular: Positive for chest pain (Chronic x 1 year (unchanged)). Negative for leg swelling.  Gastrointestinal: Positive for  nausea, vomiting and diarrhea. Negative for constipation, blood in stool and abdominal distention.  Genitourinary: Positive for urgency and frequency. Negative for vaginal bleeding, vaginal discharge, vaginal pain and pelvic pain.  Musculoskeletal: Positive for back pain.  Skin: Negative for wound.  Allergic/Immunologic: Negative for immunocompromised state.  All other systems reviewed and are negative.    Allergies  Review of patient's allergies indicates no known allergies.  Home Medications   Current Outpatient Rx  Name  Route  Sig  Dispense  Refill  . amoxicillin (AMOXIL) 500 MG capsule   Oral   Take 1 capsule (500 mg total) by mouth 3 (three) times daily.   30 capsule   0   . ibuprofen (ADVIL,MOTRIN) 200 MG tablet   Oral   Take 800 mg by mouth every 6 (six) hours as needed for pain.         Marland Kitchen ondansetron (ZOFRAN ODT) 4 MG disintegrating tablet      4mg  ODT q4 hours prn nausea/vomit   10 tablet   0    BP 105/64  Pulse 107  Temp(Src) 97.8 F (36.6 C) (Oral)  Resp 19  Wt 120 lb (54.432 kg)  SpO2 98% Physical Exam  Nursing note and vitals reviewed. Constitutional: She is oriented to person, place, and time. She appears well-developed and well-nourished. No distress.  HENT:  Head: Normocephalic and atraumatic.  Eyes: EOM are normal. Pupils are equal, round, and reactive to light. Right eye exhibits no discharge. Left eye exhibits no  discharge. No scleral icterus.  Neck: Normal range of motion. Neck supple.  Cardiovascular: Normal rate, regular rhythm and intact distal pulses.   Pulmonary/Chest: Effort normal and breath sounds normal. No respiratory distress. She has no wheezes. She has no rhonchi. She has no rales.  Abdominal: Soft. Normal appearance and bowel sounds are normal. She exhibits no distension, no pulsatile liver, no fluid wave, no abdominal bruit, no ascites, no pulsatile midline mass and no mass. There is tenderness in the epigastric area and  suprapubic area. There is no rebound, no guarding, no tenderness at McBurney's point and negative Murphy's sign.  Musculoskeletal: Normal range of motion. She exhibits no edema.  Neurological: She is alert and oriented to person, place, and time.  Skin: Skin is warm and dry. No rash noted. She is not diaphoretic.  Psychiatric: She has a normal mood and affect. Her behavior is normal.    ED Course  Procedures (including critical care time) Labs Review Labs Reviewed  COMPREHENSIVE METABOLIC PANEL - Abnormal; Notable for the following:    Glucose, Bld 64 (*)    Creatinine, Ser 0.31 (*)    Alkaline Phosphatase 231 (*)    All other components within normal limits  CBC WITH DIFFERENTIAL - Abnormal; Notable for the following:    RBC 5.38 (*)    MCV 68.8 (*)    MCH 23.2 (*)    Platelets 134 (*)    All other components within normal limits  LIPASE, BLOOD  URINALYSIS, ROUTINE W REFLEX MICROSCOPIC  GLUCOSE, CAPILLARY   Imaging Review No results found.  EKG Interpretation    Date/Time:  Sunday January 18 2013 07:08:11 EST Ventricular Rate:  100 PR Interval:  136 QRS Duration: 87 QT Interval:  360 QTC Calculation: 464 R Axis:   43 Text Interpretation:  Sinus tachycardia Probable left atrial enlargement ST elevation, seen on prior ECG, no change Abnormal ekg since last tracing no significant change Confirmed by MILLER  MD, BRIAN ([id number]) on 01/18/2013 7:12:14 AM            MDM   1. Nausea vomiting and diarrhea   2. Abdominal pain    Patient has no red flags for back pain. EKG shows borderline sinus tach. No signs of STEMI. No significant changes since last study.  UA negative.  Lipase negative.  Thrombocyctopenia, improved since last visit.  Patient hypoglycemic, though asymptomatic. Hypoglycemia resolved with juice given in ED.  Patient's pain controlled in ED. Patient markedly improved. Tachycardia improved with IV fluids. Patient has hx of tachycardia, suspect related to  her Hyperthyroid condition. Patient tolerating PO fluids in ED.  Patient appears stable for discharge.   Advised follow up with a PCP in 2 days. Provided resource guide. Recommend OTC peptobismol for diarrhea. Drink plenty of fluids to maintain hydration status. Return to ED if unable to tolerate fluids by mouth, develop progressive fever/chills, or symptoms continually worsening.    Meds given in ED:  Medications  sodium chloride 0.9 % bolus 1,000 mL (0 mLs Intravenous Stopped 01/18/13 0810)  ondansetron (ZOFRAN) injection 4 mg (4 mg Intravenous Given 01/18/13 0658)  morphine 2 MG/ML injection 2 mg (2 mg Intravenous Given 01/18/13 0722)  sodium chloride 0.9 % bolus 1,000 mL (0 mLs Intravenous Stopped 01/18/13 0949)  ondansetron (ZOFRAN-ODT) disintegrating tablet 4 mg (4 mg Oral Given 01/18/13 1025)    Discharge Medication List as of 01/18/2013 10:00 AM    START taking these medications   Details  ondansetron (ZOFRAN ODT) 4  MG disintegrating tablet 4mg  ODT q4 hours prn nausea/vomit, Print            Rudene Anda, [place] 01/18/13 1732

## 2013-01-18 NOTE — ED Notes (Addendum)
Pt c/o n/v/d onset yesterday. Emesis x 3-4, multiple episodes of diarrhea. Pt seen here and placed on Amoxil 01/15/13 for ear infection

## 2013-01-18 NOTE — Discharge Instructions (Signed)
Follow up with a primary care provider in 2 days. Refer to attached resource guide for reference. Take medications as directed for nausea/vomiting. May take OTC peptobismol for diarrhea. Suspect symptoms will likely subside in 1-2 days. Drink plenty of fluids to maintain hydration status. Return to ED if unable to tolerate fluids by mouth, develop progressive fever/chills, or symptoms continually worsening.    Emergency Department Resource Guide 1) Find a Doctor and Pay Out of Pocket Although you won't have to find out who is covered by your insurance plan, it is a good idea to ask around and get recommendations. You will then need to call the office and see if the doctor you have chosen will accept you as a new patient and what types of options they offer for patients who are self-pay. Some doctors offer discounts or will set up payment plans for their patients who do not have insurance, but you will need to ask so you aren't surprised when you get to your appointment.  2) Contact Your Local Health Department Not all health departments have doctors that can see patients for sick visits, but many do, so it is worth a call to see if yours does. If you don't know where your local health department is, you can check in your phone book. The CDC also has a tool to help you locate your state's health department, and many state websites also have listings of all of their local health departments.  3) Find a Walk-in Clinic If your illness is not likely to be very severe or complicated, you may want to try a walk in clinic. These are popping up all over the country in pharmacies, drugstores, and shopping centers. They're usually staffed by nurse practitioners or physician assistants that have been trained to treat common illnesses and complaints. They're usually fairly quick and inexpensive. However, if you have serious medical issues or chronic medical problems, these are probably not your best option.  No  Primary Care Doctor: - Call Health Connect at  [phone] - they can help you locate a primary care doctor that  accepts your insurance, provides certain services, etc. - Physician Referral Service- [phone]  Chronic Pain Problems: Organization         Address  Phone   Notes  [facility]  [phone] Patients need to be referred by their primary care doctor.   Medication Assistance: Organization         Address  Phone   Notes  [facility] Medication [facility] [address] [place] [place], [place] [address] [phone] --Must be a resident of [facility] -- Must have NO insurance coverage whatsoever (no Medicaid/ Medicare, etc.) -- The pt. MUST have a primary care doctor that directs their care regularly and follows them in the community   [facility]  [phone]   [employer]  [phone]    Agencies that provide inexpensive medical care: Organization         Address  Phone   Notes  [facility]  [phone]   [facility]    [phone]   [facility] [address] [place], [place] [address] [phone]   [facility] [address] [place] [place] [phone]   Planned Parenthood    [phone]   [facility]    [phone]   [facility]  [address] [place], [place]  Phone:  [phone], Fax:  [phone] Hours of Operation:  9 am - 6 pm, M-F.  Also accepts Medicaid/Medicare and self-pay.  [facility] for [address] [place], [address], [place] Phone: [phone], Fax: [phone]. Hours of Operation:  8:30 am - 5:30 pm, M-F.  Also accepts Medicaid and self-pay.  [facility] [address] [place] Phone: [phone]   [place], [place], [place] [phone], Ext. [phone] Mondays & Thursdays: 7-9 AM.  First 15 patients are seen on  a first come, first serve basis.    [place] Providers:  Organization         Address  Phone   Notes  [facility] [address], [place] [phone] Also accepts self-pay patients.  [facility] [address], [place]  [phone]   [place], [address], [place] [phone]   [facility] [address], [place] [phone]   Lucianne Lei [address], [place]   [phone] Only accepts [place] Access [place] patients after they have their name applied to their card.   Self-Pay (no insurance) in [facility]:  Organization         Address  Phone   Notes  Sickle Cell Patients, [facility] [place] [phone]   [facility] [place] [phone]   [facility] [place]  [place], [place], [place] [phone]   [facility]/Dr. Osei-Bonsu  [address], [place] or [address], [place] [phone] Phone number for both [place] and [place] locations is the same.  [facility] [address] [place] [phone]   [facility] [address], [place] or [address] [phone]   [facility] [address] [place] [phone], phone; [phone], fax Sees patients 1st and 3rd Saturday of every month.  Must not qualify for public or private insurance (i.e. Medicaid, Medicare, [place] Health Choice, Veterans' Benefits)  Household income should be no more than 200% of the poverty level The clinic cannot treat you if you are pregnant or think you are pregnant  Sexually transmitted diseases are not treated at the clinic.    Dental Care: Organization          Address  Phone  Notes  [facility] Department of [facility] [place] [phone] Accepts children up to age 69 who are enrolled in [place] or [place]; pregnant women with a Medicaid card; and children who have applied for Medicaid or [place] Health Choice, but were declined, whose parents can pay a reduced fee at time of service.  [facility] Department of [facility]  [address], [place] [phone] Accepts children up to age 76 who are enrolled in [place] or [place]; pregnant women with a Medicaid card; and children who have applied for Medicaid or [place] Health Choice, but were declined, whose parents can pay a reduced fee at time of service.  [place] Adult [facility] Access PRO[facility]  [place] [phone] Patients are seen by appointment  only. Walk-ins are not accepted. [place] will see patients 36 years of age and older. Monday - Tuesday (8am-5pm) Most Wednesdays (8:30-5pm) $30 per visit, cash only  [facility] Adult Dental Access PROGRAM  [address], [facility] [phone] Patients are seen by appointment only. Walk-ins are not accepted. [place] will see patients 67 years of age and older. One Wednesday Evening (Monthly: Volunteer Based).  $30 per visit, cash only  [place]  [phone] for adults; Children under age 36, call Graduate Pediatric Dentistry at [phone]. Children aged 50-14, please call [phone] to request a pediatric application.  Dental services are provided in all areas of dental care including fillings, crowns and bridges, complete and partial dentures, implants, gum treatment, root canals, and extractions. Preventive care is also provided. Treatment is provided to both adults and children. Patients are selected via a lottery and there is often a waiting list.   [facility] [address] [place]  [phone] [email or web address]   [facility] [address] [place], [place] [phone], Ext. [phone] Second and Fourth Thursday of each month, opens at 6:30 AM; Clinic ends at 9 AM.  Patients are seen on a first-come first-served basis, and a limited number are seen during each clinic.   [facility]  [address] Hillard Danker [place], [place] [phone]   Eligibility Requirements You must have lived in [place], [place], or [place] counties for at least the last three months.   You cannot be eligible for state or federal sponsored [employer], including [employer], [place], or [employer].   You generally cannot be eligible for healthcare insurance through your employer.    How to apply: Eligibility screenings are held every Tuesday and Wednesday afternoon from 1:00 pm until 4:00 pm. You do not need an appointment for the interview!  [facility] [address], [place], [place]   [place]  [place] Department  [place]  [phone]    Behavioral Health Resources in the Community: Intensive Outpatient Programs Organization         Address  Phone  Notes  [place]. [address] [place], [place] [phone]   [facility] Outpatient [address], [place], [place]   ADS: Alcohol & Drug Svcs [address] [place], [place]   [place] [address],  [place], [place] or [phone]   Substance Abuse Resources Organization         Address  Phone  Notes  Alcohol and Drug Services  [phone]   [place]  [phone]   The [place]   Chinita Pester  [phone]   Residential & Outpatient Substance Abuse Program  [phone]   Psychological  Services Organization         Address  Phone  Notes  [facility] [place]  [place]  [phone]   [place] [address] [place] [phone] or [phone]    Mobile Crisis Teams Organization         Address  Phone  Notes  [facility], Mobile Crisis Care Unit  [phone]   [facility]  [address] [place], [place]   [facility] [address], [place] [phone]  Self-Help/Support Groups Organization         Address  Phone             Notes  [facility] [place] - variety of support groups  [place] Call for more information  Narcotics Anonymous (NA), [facility] [address], [employer] [place]  2 meetings at this location   Special educational needs teacher         Address  Phone  Notes  ASAP Residential Treatment [place],    [place]  [phone]   [facility]  [address], [place] [phone], [place], [place]   [place], [place] [phone] Admissions: 8am-3pm M-F  Incentives Substance [place] [address],    [place], [place] [phone]   [facility] [address] [place], [place], [place]   [facility] [address]  [place], [place]   [facility] - Intensive Outpatient [address], [place], [place]   [facility] [place] [address] [place]  [place], [place] [phone] or [phone]   [facility] ([facility]) [address], [place], [place] Accepts Medicaid  [facility] [address],  [place] [phone] Substance Abuse/Addiction Treatment   [facility] Organization         Address  Phone  Notes  [facility]  [phone]   Domenic Schwab, PhD [address] Arlis [place], [place]   [phone] or [phone]   [place], [place] [phone]   [facility] [address], [place], [place] [phone] Insurance/Medicaid/sponsorship through [facility] and Families [address]                                    [place], [place] [phone] [place] [address], [place] [phone]    Dr. Adele Schilder  [phone]   [facility] Dept. 1) [address], [place] 2) [place] 3)  [place] [address], [place] [phone]  [phone]   [place] [phone] or [phone] (After Hours)

## 2013-01-18 NOTE — ED Notes (Signed)
Patient states she will call when she has to urinate

## 2013-01-19 NOTE — ED Provider Notes (Signed)
Medical screening examination/treatment/procedure(s) were performed by non-physician practitioner and as supervising physician I was immediately available for consultation/collaboration.    Vida Roller, MD 01/19/13 [phone]

## 2013-01-30 ENCOUNTER — Encounter: Payer: Self-pay | Admitting: Family Medicine

## 2013-01-30 ENCOUNTER — Ambulatory Visit (INDEPENDENT_AMBULATORY_CARE_PROVIDER_SITE_OTHER): Payer: PRIVATE HEALTH INSURANCE | Admitting: Family Medicine

## 2013-01-30 VITALS — BP 135/69 | HR 114 | Temp 98.5°F | Ht 62.0 in | Wt 115.0 lb

## 2013-01-30 DIAGNOSIS — E0590 Thyrotoxicosis, unspecified without thyrotoxic crisis or storm: Secondary | ICD-10-CM | POA: Insufficient documentation

## 2013-01-30 DIAGNOSIS — R000 Tachycardia, unspecified: Secondary | ICD-10-CM

## 2013-01-30 DIAGNOSIS — I498 Other specified cardiac arrhythmias: Secondary | ICD-10-CM

## 2013-01-30 MED ORDER — BENZONATATE 200 MG PO CAPS: 200.0000 mg | ORAL_CAPSULE | Freq: Three times a day (TID) | ORAL | Status: DC | PRN

## 2013-01-30 MED ORDER — ATENOLOL 50 MG PO TABS: 50.0000 mg | ORAL_TABLET | Freq: Every day | ORAL | Status: DC

## 2013-01-30 NOTE — Assessment & Plan Note (Addendum)
Pertinent S&O  6 mo Hx of palpitations, 80 lbs wt loss, jitteriness  Multiple ED visit for similar symptoms: No TSH - Sinus Tachycardic today and ED EKGs  Moderately enlarged Thyroid w/ mild tenderness  Denies CP, SOB today; Denies Drug use  Report Dark stools, but says "she's always been anemic"  Assessment/Plan  Her symptoms, story, and physical are very consistent with hyperthyroidism.  Would be very surprised if thyroid studies were normal.  Anemia induced tachycardia also possible however , less likely with mildly decreased hemoglobin.  We'll not recheck CBC or BMET as recently done and ED. Do not suspect drug abuse (cocaine, methamphetamines) but will check UDS.  Check TSH, Free T4/T3, Thyroid antibodies  Check UDS and HIV (multiple recent ED visits for "viral illnesses")  Atenolol 25 mg qd for symptom management while labs pending

## 2013-01-30 NOTE — Progress Notes (Signed)
Subjective:     Patient ID: Julie Ware, female   DOB: 03-24-1955, 58 y.o.   MRN: [id number]  HPI Comments: Ms Cerulli reports palpitations, jitteriness, shaking, fatigue, diffuse leg pain, and 80 lbs wt loss since July 2014. She reports she was seen by a physician ~ month ago and was told she "looked like she had a goiter and probably thyroid problems." However no test where done because she did not have insurance. She denies any current CP or SOB but reports multiple ED visits for CP and palpitations and was told she had a viral infection. She denies any alcohol or drug use.  Today she also endorses sore throat and cough.      Review of Systems  Constitutional: Positive for chills, fatigue and unexpected weight change. Negative for appetite change.  HENT: Positive for congestion and sore throat.   Respiratory: Positive for cough. Negative for chest tightness and shortness of breath.   Cardiovascular: Positive for palpitations. Negative for chest pain.  Endocrine: Positive for cold intolerance.  Musculoskeletal: Positive for myalgias.  Neurological: Positive for tremors and weakness.       Objective:   Physical Exam  Constitutional:  Thin, tremulous female  HENT:  Mouth/Throat: Oropharynx is clear and moist. No oropharyngeal exudate.  Eyes: EOM are normal. Pupils are equal, round, and reactive to light.  Neck: Neck supple. Thyromegaly present.  Mild thyroid tenderness  Cardiovascular:  Tachycardia w/ prominent S1S2 no m/r/g  Pulmonary/Chest: Effort normal and breath sounds normal. She has no wheezes.  Abdominal: Soft. There is no tenderness.  Musculoskeletal: She exhibits tenderness. She exhibits no edema.  Lymphadenopathy:    She has no cervical adenopathy.   Assessment/Plan:      See Problem Focused Assessment & Plan

## 2013-01-30 NOTE — Patient Instructions (Signed)
It was great seeing you today.   1. I am checking you thyroid test today and will let you know the results. 2. In the meantime start Atenolol 25 mg for your heart rate.    Please bring all your medications to every doctors visit  Sign up for My Chart to have easy access to your labs results, and communication with your Primary care physician.  I look forward to talking with you again at our next visit. If you have any questions or concerns before then, please call the clinic at [phone].  Take Care,   Dr Wenda Low

## 2013-01-31 ENCOUNTER — Other Ambulatory Visit: Payer: Self-pay | Admitting: Family Medicine

## 2013-01-31 DIAGNOSIS — E0590 Thyrotoxicosis, unspecified without thyrotoxic crisis or storm: Secondary | ICD-10-CM

## 2013-01-31 LAB — T4, FREE: Free T4: 7.11 ng/dL — ABNORMAL HIGH (ref 0.80–1.80)

## 2013-01-31 LAB — HIV ANTIBODY (ROUTINE TESTING W REFLEX): HIV: NONREACTIVE

## 2013-01-31 LAB — DRUG SCREEN, URINE
Amphetamine Screen, Ur: NEGATIVE
BARBITURATE QUANT UR: NEGATIVE
Benzodiazepines.: NEGATIVE
CREATININE, U: 31.99 mg/dL
Cocaine Metabolites: NEGATIVE
MARIJUANA METABOLITE: NEGATIVE
Methadone: NEGATIVE
OPIATES: NEGATIVE
PROPOXYPHENE: NEGATIVE
Phencyclidine (PCP): NEGATIVE

## 2013-01-31 LAB — TSH

## 2013-01-31 LAB — T3, FREE: T3, Free: >20 pg/mL — ABNORMAL HIGH (ref 2.3–4.2)

## 2013-02-02 ENCOUNTER — Telehealth: Payer: Self-pay | Admitting: Family Medicine

## 2013-02-02 ENCOUNTER — Other Ambulatory Visit: Payer: Self-pay | Admitting: Family Medicine

## 2013-02-02 DIAGNOSIS — E0590 Thyrotoxicosis, unspecified without thyrotoxic crisis or storm: Secondary | ICD-10-CM

## 2013-02-02 LAB — THYROID ANTIBODIES: Thyroperoxidase Ab SerPl-aCnc: 275 IU/mL — ABNORMAL HIGH (ref <35.0)

## 2013-02-02 NOTE — Progress Notes (Signed)
Call pt and informed her of Thyroid US on 1/28 @ 11am, and her uptake scan on 1/29 @ noon. Also gave her scheduling number: [phone]; if those times didn't work for her.   Chanetta Marshall

## 2013-02-02 NOTE — Telephone Encounter (Signed)
Returning dr Scarlette Shorts call

## 2013-02-04 ENCOUNTER — Encounter (HOSPITAL_COMMUNITY)
Admission: RE | Admit: 2013-02-04 | Discharge: 2013-02-04 | Disposition: A | Discharge status: Home / Self Care | Payer: PRIVATE HEALTH INSURANCE | Source: Ambulatory Visit | Attending: Family Medicine | Admitting: Family Medicine

## 2013-02-04 DIAGNOSIS — E0590 Thyrotoxicosis, unspecified without thyrotoxic crisis or storm: Secondary | ICD-10-CM | POA: Insufficient documentation

## 2013-02-05 ENCOUNTER — Encounter (HOSPITAL_COMMUNITY)
Admission: RE | Admit: 2013-02-05 | Discharge: 2013-02-05 | Disposition: A | Discharge status: Home / Self Care | Payer: PRIVATE HEALTH INSURANCE | Source: Ambulatory Visit | Attending: Family Medicine | Admitting: Family Medicine

## 2013-02-05 MED ORDER — SODIUM IODIDE I 131 CAPSULE
11.0000 | Freq: Once | INTRAVENOUS | Status: AC | PRN
  Administered 2013-02-05: 11 via ORAL

## 2013-02-05 MED ORDER — SODIUM PERTECHNETATE TC 99M INJECTION
10.0000 | Freq: Once | INTRAVENOUS | Status: AC | PRN
  Administered 2013-02-05: 10 via INTRAVENOUS

## 2013-02-06 ENCOUNTER — Other Ambulatory Visit: Payer: Self-pay | Admitting: Family Medicine

## 2013-02-06 MED ORDER — METHIMAZOLE 10 MG PO TABS: 15.0000 mg | ORAL_TABLET | Freq: Every day | ORAL | Status: DC

## 2013-02-06 NOTE — Progress Notes (Signed)
Called pt. Her symptoms are better controlled on atenolol 50 qd. She will start methimazole today and follow-up Monday to discuss Grave's disease treatment options.

## 2013-02-09 ENCOUNTER — Ambulatory Visit (INDEPENDENT_AMBULATORY_CARE_PROVIDER_SITE_OTHER): Payer: PRIVATE HEALTH INSURANCE | Admitting: Family Medicine

## 2013-02-09 ENCOUNTER — Encounter: Payer: Self-pay | Admitting: Family Medicine

## 2013-02-09 VITALS — BP 123/74 | HR 88 | Temp 98.3°F | Ht 62.0 in | Wt 115.0 lb

## 2013-02-09 DIAGNOSIS — E0590 Thyrotoxicosis, unspecified without thyrotoxic crisis or storm: Secondary | ICD-10-CM

## 2013-02-09 MED ORDER — METHIMAZOLE 10 MG PO TABS: 15.0000 mg | ORAL_TABLET | Freq: Every day | ORAL | Status: DC

## 2013-02-09 NOTE — Progress Notes (Signed)
Subjective:     Patient ID: Julie Ware, female   DOB: [birth year], 58 y.o.   MRN: [id number]  HPI Comments: She reports ongoing dizziness x 3 weeks with occasional intermittent vertigo, but improved palpitations, jitteriness and additional symptoms of hyperthyroidism since increasing Atenolol to 50mg  qd. Denies Cp or SOB.     Review of Systems  Constitutional: Positive for chills and unexpected weight change. Negative for fever.  Respiratory: Negative for shortness of breath.   Cardiovascular: Negative for chest pain and palpitations.  Neurological: Positive for dizziness.       Objective:   Physical Exam  Eyes:  No lid lag or proptosis    Neck: Thyromegaly present.  Mildly enlarge thyroid; no nodules appreciated   Cardiovascular: Normal rate and regular rhythm.   Pulmonary/Chest: Effort normal and breath sounds normal.  Musculoskeletal: She exhibits no edema.    Assessment/Plan:      See Problem Focused Assessment & Plan

## 2013-02-09 NOTE — Patient Instructions (Signed)
It was great seeing you today.   1. Start taking Methimazole 15 mg once a day 2. Call and let me know if you choose the Radioablation or surgery   Please bring all your medications to every doctors visit  Sign up for My Chart to have easy access to your labs results, and communication with your Primary care physician.  Next Appointment  Please call to make an appointment with Dr Gayla Doss in 6 weeks (about 03/23/13)   I look forward to talking with you again at our next visit. If you have any questions or concerns before then, please call the clinic at [phone].  Take Care,   Dr Wenda Low

## 2013-02-09 NOTE — Assessment & Plan Note (Signed)
Wasn't able to get Methimazole yet; Symptoms better on atenolol but still reports dizziness - Called Pharmacy to verify Rx received - Discussed treatment options and provided info (uptodate) - She will return in 8 wks for recheck of Thyroid fcn - If she opts for Radioablation or surgery she will call for referral  - advised to decrease Atenolol once on methimazole x 1 week as needed for symptom control

## 2013-03-23 ENCOUNTER — Ambulatory Visit (INDEPENDENT_AMBULATORY_CARE_PROVIDER_SITE_OTHER): Payer: PRIVATE HEALTH INSURANCE | Admitting: Family Medicine

## 2013-03-23 VITALS — BP 124/75 | HR 106 | Temp 98.7°F | Ht 62.0 in | Wt 117.2 lb

## 2013-03-23 DIAGNOSIS — E0590 Thyrotoxicosis, unspecified without thyrotoxic crisis or storm: Secondary | ICD-10-CM

## 2013-03-23 LAB — CBC WITH DIFFERENTIAL/PLATELET
Basophils Absolute: 0 10*3/uL (ref 0.0–0.1)
Basophils Relative: 0 % (ref 0–1)
Eosinophils Absolute: 0 10*3/uL (ref 0.0–0.7)
Eosinophils Relative: 0 % (ref 0–5)
HEMATOCRIT: 33.2 % — AB (ref 36.0–46.0)
HEMOGLOBIN: 10.8 g/dL — AB (ref 12.0–15.0)
LYMPHS PCT: 28 % (ref 12–46)
Lymphs Abs: 1 10*3/uL (ref 0.7–4.0)
MCH: 22.5 pg — ABNORMAL LOW (ref 26.0–34.0)
MCHC: 32.5 g/dL (ref 30.0–36.0)
MCV: 69.2 fL — ABNORMAL LOW (ref 78.0–100.0)
MONO ABS: 0.4 10*3/uL (ref 0.1–1.0)
MONOS PCT: 10 % (ref 3–12)
NEUTROS PCT: 62 % (ref 43–77)
Neutro Abs: 2.2 10*3/uL (ref 1.7–7.7)
Platelets: 146 10*3/uL — ABNORMAL LOW (ref 150–400)
RBC: 4.8 MIL/uL (ref 3.87–5.11)
RDW: 16.1 % — ABNORMAL HIGH (ref 11.5–15.5)
WBC: 3.6 10*3/uL — ABNORMAL LOW (ref 4.0–10.5)

## 2013-03-23 LAB — COMPREHENSIVE METABOLIC PANEL
ALT: 37 U/L — ABNORMAL HIGH (ref 0–35)
AST: 29 U/L (ref 0–37)
Albumin: 3.3 g/dL — ABNORMAL LOW (ref 3.5–5.2)
Alkaline Phosphatase: 254 U/L — ABNORMAL HIGH (ref 39–117)
BUN: 12 mg/dL (ref 6–23)
CHLORIDE: 107 meq/L (ref 96–112)
CO2: 26 mEq/L (ref 19–32)
Calcium: 8.8 mg/dL (ref 8.4–10.5)
GLUCOSE: 125 mg/dL — AB (ref 70–99)
POTASSIUM: 3.3 meq/L — AB (ref 3.5–5.3)
Sodium: 140 mEq/L (ref 135–145)
Total Bilirubin: 0.5 mg/dL (ref 0.2–1.2)
Total Protein: 6.6 g/dL (ref 6.0–8.3)

## 2013-03-23 NOTE — Progress Notes (Signed)
  Patient name: Julie Ware MRN [id number]  Date of birth: 01-29-1955  CC & HPI:  Julie Ware is a 58 y.o. female presenting today for thyroid check and hyperthyroid management discussion.   Hyperthyroidism - Reports symptom much improved: decreased somnolence, anxiety, palpitations, jitteriness - Some weight gain - Reports compliance with Methimazole, but reports diffuse pruritis and occular dryness/itching since starting methimazole - Denies trouble swallowing or breathing - No longer taking atenolol - Desires radioablation   ROS: See HPI above otherwise negative.  Medical & Surgical Hx:  Reviewed.  Medications & Allergies: Reviewed & Updated - see associated section Social History: Reviewed:   Objective Findings:  Vitals: BP 124/75  Pulse 106  Temp(Src) 98.7 F (37.1 C) (Oral)  Ht 5\' 2"  (1.575 m)  Wt 117 lb 3.2 oz (53.162 kg)  BMI 21.43 kg/m2  Gen: Mild jitteriness Eye: No exomphalos or lid lag Throat:  Moderate Goiter CV: Mild tachycardia w/ reg rhythm w/o m/r/g, pulses +2 b/l Resp: CTAB w/ normal respiratory effort Shin: No pretibial myxedema    Assessment & Plan:   Please See Problem Focused Assessment & Plan

## 2013-03-23 NOTE — Assessment & Plan Note (Signed)
Symptoms much improved, though slightly tachycardic today - Reports diffuse itching since starting Methimazole, but no physical findings of exfoliative dermatitis (mild erythema, no fevers, no leisons or ulcerations, or scaling) - Recheck TSH, Free T4, T3 for treatment response - Check CBC and CMP since started methimazole - Refer to endocrinology as she desires radioablation and having occular symptoms (dry, gritty eyes) and increased Goiter  - Continue Methimazole pending Thyroid labs; no longer taking atenolol

## 2013-03-23 NOTE — Patient Instructions (Signed)
It was great seeing you today.   1. I will check some blood work today to see how your current hyperthyroid treatment is going. Please continue your current dose of Methimazole until you here from me.  2. I have referred you to Endocrinology for consideration of Radioiodine ablation. You should receive a call from them to schedule your appointment.    Please bring all your medications to every doctors visit  Sign up for My Chart to have easy access to your labs results, and communication with your Primary care physician.  Next Appointment  Please call to make an appointment with Dr Gayla Doss in 8 weeks   I look forward to talking with you again at our next visit. If you have any questions or concerns before then, please call the clinic at [phone].  Take Care,   Dr Wenda Low

## 2013-03-24 ENCOUNTER — Other Ambulatory Visit: Payer: Self-pay | Admitting: Family Medicine

## 2013-03-24 LAB — T3: T3 TOTAL: 636.1 ng/dL — AB (ref 80.0–204.0)

## 2013-03-24 LAB — T4, FREE: Free T4: 5.04 ng/dL — ABNORMAL HIGH (ref 0.80–1.80)

## 2013-03-24 LAB — TSH: TSH: <0.008 u[IU]/mL — ABNORMAL LOW (ref 0.350–4.500)

## 2013-03-24 MED ORDER — METHIMAZOLE 10 MG PO TABS: 10.0000 mg | ORAL_TABLET | Freq: Three times a day (TID) | ORAL | Status: DC

## 2013-03-24 MED ORDER — CETIRIZINE HCL 10 MG PO CAPS: 10.0000 mg | ORAL_CAPSULE | Freq: Every day | ORAL | Status: DC

## 2013-03-24 NOTE — Progress Notes (Signed)
Called patient and advised her to increase Methimazole to 1 pill (10mg ) TID, and start Zyrtec 10mg  qd for itching. Also instructed her to call clinic and report have she is tolerating the increased Methimazole and any improvement in itching with starting Zyrtec. If itching not improved, and no signs of exfoliative dermatitis will try Hydroxyzine; if developing dermatitis will call endocrinology to expedite referral and consider switching to PTU.

## 2013-04-03 ENCOUNTER — Encounter: Payer: Self-pay | Admitting: *Deleted

## 2013-06-17 ENCOUNTER — Inpatient Hospital Stay (HOSPITAL_COMMUNITY)
Admission: EM | Admit: 2013-06-17 | Discharge: 2013-06-24 | DRG: 313 | Disposition: A | Discharge status: Home / Self Care | Payer: PRIVATE HEALTH INSURANCE | Attending: Family Medicine | Admitting: Family Medicine

## 2013-06-17 ENCOUNTER — Encounter (HOSPITAL_COMMUNITY): Payer: Self-pay | Admitting: Emergency Medicine

## 2013-06-17 DIAGNOSIS — R945 Abnormal results of liver function studies: Secondary | ICD-10-CM

## 2013-06-17 DIAGNOSIS — Z9119 Patient's noncompliance with other medical treatment and regimen: Secondary | ICD-10-CM

## 2013-06-17 DIAGNOSIS — R932 Abnormal findings on diagnostic imaging of liver and biliary tract: Secondary | ICD-10-CM

## 2013-06-17 DIAGNOSIS — Z8249 Family history of ischemic heart disease and other diseases of the circulatory system: Secondary | ICD-10-CM

## 2013-06-17 DIAGNOSIS — M940 Chondrocostal junction syndrome [Tietze]: Secondary | ICD-10-CM | POA: Diagnosis present

## 2013-06-17 DIAGNOSIS — Z91199 Patient's noncompliance with other medical treatment and regimen due to unspecified reason: Secondary | ICD-10-CM

## 2013-06-17 DIAGNOSIS — E0590 Thyrotoxicosis, unspecified without thyrotoxic crisis or storm: Secondary | ICD-10-CM | POA: Diagnosis present

## 2013-06-17 DIAGNOSIS — E0500 Thyrotoxicosis with diffuse goiter without thyrotoxic crisis or storm: Secondary | ICD-10-CM | POA: Diagnosis present

## 2013-06-17 DIAGNOSIS — D638 Anemia in other chronic diseases classified elsewhere: Secondary | ICD-10-CM | POA: Diagnosis present

## 2013-06-17 DIAGNOSIS — K769 Liver disease, unspecified: Secondary | ICD-10-CM | POA: Diagnosis present

## 2013-06-17 DIAGNOSIS — I428 Other cardiomyopathies: Secondary | ICD-10-CM | POA: Diagnosis present

## 2013-06-17 DIAGNOSIS — R0789 Other chest pain: Principal | ICD-10-CM | POA: Diagnosis present

## 2013-06-17 DIAGNOSIS — F411 Generalized anxiety disorder: Secondary | ICD-10-CM | POA: Diagnosis present

## 2013-06-17 DIAGNOSIS — I420 Dilated cardiomyopathy: Secondary | ICD-10-CM

## 2013-06-17 DIAGNOSIS — R079 Chest pain, unspecified: Secondary | ICD-10-CM | POA: Diagnosis present

## 2013-06-17 DIAGNOSIS — D509 Iron deficiency anemia, unspecified: Secondary | ICD-10-CM | POA: Diagnosis present

## 2013-06-17 DIAGNOSIS — Z79899 Other long term (current) drug therapy: Secondary | ICD-10-CM

## 2013-06-17 DIAGNOSIS — I959 Hypotension, unspecified: Secondary | ICD-10-CM | POA: Diagnosis present

## 2013-06-17 DIAGNOSIS — I5041 Acute combined systolic (congestive) and diastolic (congestive) heart failure: Secondary | ICD-10-CM | POA: Diagnosis present

## 2013-06-17 DIAGNOSIS — R1011 Right upper quadrant pain: Secondary | ICD-10-CM

## 2013-06-17 DIAGNOSIS — I509 Heart failure, unspecified: Secondary | ICD-10-CM | POA: Diagnosis present

## 2013-06-17 DIAGNOSIS — I2789 Other specified pulmonary heart diseases: Secondary | ICD-10-CM | POA: Diagnosis present

## 2013-06-17 DIAGNOSIS — R5381 Other malaise: Secondary | ICD-10-CM

## 2013-06-17 DIAGNOSIS — I517 Cardiomegaly: Secondary | ICD-10-CM

## 2013-06-17 DIAGNOSIS — R5383 Other fatigue: Secondary | ICD-10-CM

## 2013-06-17 HISTORY — DX: Thyrotoxicosis with diffuse goiter without thyrotoxic crisis or storm: E05.00

## 2013-06-17 HISTORY — DX: Iron deficiency anemia, unspecified: D50.9

## 2013-06-17 HISTORY — DX: Thrombocytopenia, unspecified: D69.6

## 2013-06-17 NOTE — ED Notes (Signed)
Pt complains of central chest pain all day today that radiates to her back

## 2013-06-18 ENCOUNTER — Emergency Department (HOSPITAL_COMMUNITY): Payer: PRIVATE HEALTH INSURANCE

## 2013-06-18 ENCOUNTER — Encounter (HOSPITAL_COMMUNITY): Payer: Self-pay

## 2013-06-18 ENCOUNTER — Inpatient Hospital Stay (HOSPITAL_COMMUNITY): Payer: PRIVATE HEALTH INSURANCE

## 2013-06-18 DIAGNOSIS — R5383 Other fatigue: Secondary | ICD-10-CM

## 2013-06-18 DIAGNOSIS — R1011 Right upper quadrant pain: Secondary | ICD-10-CM

## 2013-06-18 DIAGNOSIS — I517 Cardiomegaly: Secondary | ICD-10-CM | POA: Diagnosis present

## 2013-06-18 DIAGNOSIS — R079 Chest pain, unspecified: Secondary | ICD-10-CM | POA: Diagnosis present

## 2013-06-18 DIAGNOSIS — I359 Nonrheumatic aortic valve disorder, unspecified: Secondary | ICD-10-CM

## 2013-06-18 DIAGNOSIS — R5381 Other malaise: Secondary | ICD-10-CM

## 2013-06-18 LAB — HEPATIC FUNCTION PANEL
ALBUMIN: 3.1 g/dL — AB (ref 3.5–5.2)
ALT: 28 U/L (ref 0–35)
AST: 35 U/L (ref 0–37)
Alkaline Phosphatase: 246 U/L — ABNORMAL HIGH (ref 39–117)
BILIRUBIN TOTAL: 0.6 mg/dL (ref 0.3–1.2)
Bilirubin, Direct: <0.2 mg/dL (ref 0.0–0.3)
Total Protein: 6.9 g/dL (ref 6.0–8.3)

## 2013-06-18 LAB — I-STAT TROPONIN, ED
TROPONIN I, POC: 0.02 ng/mL (ref 0.00–0.08)
Troponin i, poc: 0.01 ng/mL (ref 0.00–0.08)

## 2013-06-18 LAB — BASIC METABOLIC PANEL
BUN: 16 mg/dL (ref 6–23)
CALCIUM: 9.4 mg/dL (ref 8.4–10.5)
CO2: 24 mEq/L (ref 19–32)
Chloride: 107 mEq/L (ref 96–112)
Creatinine, Ser: 0.33 mg/dL — ABNORMAL LOW (ref 0.50–1.10)
GFR calc Af Amer: >90 mL/min (ref >90)
Glucose, Bld: 100 mg/dL — ABNORMAL HIGH (ref 70–99)
POTASSIUM: 4.2 meq/L (ref 3.7–5.3)
SODIUM: 145 meq/L (ref 137–147)

## 2013-06-18 LAB — T4, FREE: Free T4: 6.66 ng/dL — ABNORMAL HIGH (ref 0.80–1.80)

## 2013-06-18 LAB — CBC
HCT: 33.7 % — ABNORMAL LOW (ref 36.0–46.0)
Hemoglobin: 10.7 g/dL — ABNORMAL LOW (ref 12.0–15.0)
MCH: 22.8 pg — AB (ref 26.0–34.0)
MCHC: 31.8 g/dL (ref 30.0–36.0)
MCV: 71.9 fL — ABNORMAL LOW (ref 78.0–100.0)
PLATELETS: 138 10*3/uL — AB (ref 150–400)
RBC: 4.69 MIL/uL (ref 3.87–5.11)
RDW: 15.2 % (ref 11.5–15.5)
WBC: 5.8 10*3/uL (ref 4.0–10.5)

## 2013-06-18 LAB — I-STAT CG4 LACTIC ACID, ED: Lactic Acid, Venous: 0.74 mmol/L (ref 0.5–2.2)

## 2013-06-18 LAB — TSH: TSH: 0.008 u[IU]/mL — ABNORMAL LOW (ref 0.350–4.500)

## 2013-06-18 LAB — LIPASE, BLOOD: Lipase: 17 U/L (ref 11–59)

## 2013-06-18 LAB — TROPONIN I: Troponin I: <0.3 ng/mL (ref <0.30)

## 2013-06-18 LAB — PRO B NATRIURETIC PEPTIDE: Pro B Natriuretic peptide (BNP): 5337 pg/mL — ABNORMAL HIGH (ref 0–125)

## 2013-06-18 LAB — T3, FREE: T3, Free: >20 pg/mL — ABNORMAL HIGH (ref 2.3–4.2)

## 2013-06-18 MED ORDER — SODIUM CHLORIDE 0.9 % IV BOLUS (SEPSIS)
1000.0000 mL | Freq: Once | INTRAVENOUS | Status: AC
  Administered 2013-06-18: 1000 mL via INTRAVENOUS

## 2013-06-18 MED ORDER — GI COCKTAIL ~~LOC~~
30.0000 mL | Freq: Four times a day (QID) | ORAL | Status: DC | PRN
  Administered 2013-06-22: 30 mL via ORAL
  Filled 2013-06-18: qty 30

## 2013-06-18 MED ORDER — ASPIRIN EC 325 MG PO TBEC
325.0000 mg | DELAYED_RELEASE_TABLET | Freq: Every day | ORAL | Status: DC
  Administered 2013-06-18 – 2013-06-24 (×7): 325 mg via ORAL
  Filled 2013-06-18 (×8): qty 1

## 2013-06-18 MED ORDER — SODIUM CHLORIDE 0.9 % IV SOLN
INTRAVENOUS | Status: DC
  Administered 2013-06-18: 14:00:00 via INTRAVENOUS

## 2013-06-18 MED ORDER — IOHEXOL 350 MG/ML SOLN
100.0000 mL | Freq: Once | INTRAVENOUS | Status: AC | PRN
  Administered 2013-06-18: 100 mL via INTRAVENOUS

## 2013-06-18 MED ORDER — METHIMAZOLE 10 MG PO TABS
10.0000 mg | ORAL_TABLET | Freq: Three times a day (TID) | ORAL | Status: DC
  Administered 2013-06-18 – 2013-06-24 (×17): 10 mg via ORAL
  Filled 2013-06-18 (×20): qty 1

## 2013-06-18 MED ORDER — HEPARIN SODIUM (PORCINE) 5000 UNIT/ML IJ SOLN
5000.0000 [IU] | Freq: Three times a day (TID) | INTRAMUSCULAR | Status: DC
  Administered 2013-06-18 – 2013-06-24 (×18): 5000 [IU] via SUBCUTANEOUS
  Filled 2013-06-18 (×21): qty 1

## 2013-06-18 MED ORDER — METOPROLOL TARTRATE 25 MG PO TABS
25.0000 mg | ORAL_TABLET | Freq: Two times a day (BID) | ORAL | Status: DC
  Administered 2013-06-18 (×2): 25 mg via ORAL
  Filled 2013-06-18 (×4): qty 1

## 2013-06-18 MED ORDER — MORPHINE SULFATE 2 MG/ML IJ SOLN
2.0000 mg | INTRAMUSCULAR | Status: DC | PRN
  Administered 2013-06-18 – 2013-06-21 (×7): 2 mg via INTRAVENOUS
  Filled 2013-06-18 (×7): qty 1

## 2013-06-18 MED ORDER — PANTOPRAZOLE SODIUM 20 MG PO TBEC
20.0000 mg | DELAYED_RELEASE_TABLET | Freq: Every day | ORAL | Status: DC
  Administered 2013-06-19 – 2013-06-20 (×2): 20 mg via ORAL
  Filled 2013-06-18 (×3): qty 1

## 2013-06-18 MED ORDER — FENTANYL CITRATE 0.05 MG/ML IJ SOLN
50.0000 ug | Freq: Once | INTRAMUSCULAR | Status: AC
  Administered 2013-06-18: 50 ug via INTRAVENOUS
  Filled 2013-06-18: qty 2

## 2013-06-18 MED ORDER — MORPHINE SULFATE 4 MG/ML IJ SOLN
2.0000 mg | Freq: Once | INTRAMUSCULAR | Status: AC
  Administered 2013-06-18: 2 mg via INTRAVENOUS
  Filled 2013-06-18: qty 1

## 2013-06-18 MED ORDER — MORPHINE SULFATE 2 MG/ML IJ SOLN
2.0000 mg | Freq: Once | INTRAMUSCULAR | Status: AC
  Administered 2013-06-18: 2 mg via INTRAVENOUS
  Filled 2013-06-18: qty 1

## 2013-06-18 NOTE — ED Provider Notes (Signed)
CSN: [id number]     Arrival date & time 06/17/13  2200 History   First MD Initiated Contact with Patient 06/18/13 0207     Chief Complaint  Patient presents with  . Chest Pain    (Consider location/radiation/quality/duration/timing/severity/associated sxs/prior Treatment) HPI Comments: Patient is a 58 year old female with a history of anxiety and hyperthyroidism who presents to the emergency department for chest pain. Patient states the chest pain began this morning and has been sharp and constant without alleviating factors. Patient states the chest pain radiates to the back and is worse with movement and deep breathing. She states that symptoms have been associated with lightheadedness, palpitations, and cough. Patient has also been experiencing diaphoresis and diarrhea recently. She denies associated fever, nausea or vomiting, syncope, hemoptysis, leg swelling, melena or hematochezia, any urinary symptoms.  Patient states that she is supposed to be on methimazole for her hyperthyroidism; however, she has been noncompliant with his medication for the last month. She states that she is still taking her atenolol daily. PCP - Dr. Gayla DossJoyner.  The history is provided by the patient. No language interpreter was used.    Past Medical History  Diagnosis Date  . Thyroid disease   . Anxiety    Past Surgical History  Procedure Laterality Date  . Cesarean section      X 3 (1978, 1980, 1992)  . Tubal ligation  1992   Family History  Problem Relation Age of Onset  . Diabetes Mother   . Hypertension Mother   . Hyperlipidemia Mother   . Hyperlipidemia Father   . Hypertension Father   . Diabetes Father   . Hypertension Paternal Grandmother   . Hypertension Maternal Grandfather   . Diabetes Paternal Grandmother   . Diabetes Maternal Grandfather    History  Substance Use Topics  . Smoking status: Never Smoker   . Smokeless tobacco: Not on file  . Alcohol Use: No   OB History   Grav Para  Term Preterm Abortions TAB SAB Ect Mult Living                  Review of Systems  Constitutional: Positive for diaphoresis. Negative for fever and unexpected weight change.  Respiratory: Positive for cough and shortness of breath.        No hemoptysis  Cardiovascular: Positive for chest pain and palpitations.  Gastrointestinal: Positive for diarrhea. Negative for nausea and vomiting.  Genitourinary: Negative for dysuria and hematuria.  Neurological: Positive for light-headedness. Negative for syncope.  All other systems reviewed and are negative.    Allergies  Tylenol  Home Medications   Prior to Admission medications   Medication Sig Start Date End Date Taking? Authorizing Provider  ATENOLOL PO Take 1 tablet by mouth daily.   Yes Historical Provider, MD  ibuprofen (ADVIL,MOTRIN) 200 MG tablet Take 800 mg by mouth every 6 (six) hours as needed for pain.   Yes Historical Provider, MD   BP 135/72  Pulse 94  Temp(Src) 98.4 F (36.9 C) (Oral)  Resp 27  SpO2 95%  Physical Exam  Nursing note and vitals reviewed. Constitutional: She is oriented to person, place, and time. She appears well-developed and well-nourished. No distress.  Chronically ill appearing; thin stature. NAD.  HENT:  Head: Normocephalic and atraumatic.  Dry mm.  Eyes: Conjunctivae and EOM are normal. Pupils are equal, round, and reactive to light. No scleral icterus.  Neck: Normal range of motion. Neck supple. Thyromegaly present.  Cardiovascular: Normal rate, regular rhythm  and normal heart sounds.   Pulmonary/Chest: No accessory muscle usage. Tachypnea (mild to moderate, intermittent) noted. No respiratory distress. She has no wheezes. She has no rales. She exhibits tenderness.  Abdominal: Soft. She exhibits no distension. There is tenderness. There is no rebound and no guarding.  Soft abdomen. Mild TTP in epigastric region. No peritoneal signs.  Musculoskeletal: Normal range of motion.  Neurological: She  is alert and oriented to person, place, and time.  GCS 15. Patient moves extremities without ataxia.  Skin: Skin is warm and dry. No rash noted. She is not diaphoretic. No erythema. No pallor.  Psychiatric: She has a normal mood and affect. Her behavior is normal.    ED Course  Procedures (including critical care time) Labs Review Labs Reviewed  CBC - Abnormal; Notable for the following:    Hemoglobin 10.7 (*)    HCT 33.7 (*)    MCV 71.9 (*)    MCH 22.8 (*)    Platelets 138 (*)    All other components within normal limits  BASIC METABOLIC PANEL - Abnormal; Notable for the following:    Glucose, Bld 100 (*)    Creatinine, Ser 0.33 (*)    All other components within normal limits  PRO B NATRIURETIC PEPTIDE - Abnormal; Notable for the following:    Pro B Natriuretic peptide (BNP) 5337.0 (*)    All other components within normal limits  TSH  HEPATIC FUNCTION PANEL  LIPASE, BLOOD  I-STAT TROPOININ, ED  I-STAT CG4 LACTIC ACID, ED  Rosezena Sensor, ED   Imaging Review Dg Chest 2 View  06/18/2013   CLINICAL DATA:  Chest pain and shortness of breath  EXAM: CHEST  2 VIEW  COMPARISON:  06/29/2012  FINDINGS: Cardiac enlargement. Pulmonary vascularity is normal. Interstitial changes probably fibrosis but early interstitial edema not excluded. No focal consolidation or airspace disease. No blunting of costophrenic angles.  IMPRESSION: Cardiac enlargement. Nonspecific mild interstitial changes. No focal consolidation.   Electronically Signed   By: Burman Nieves M.D.   On: 06/18/2013 02:38   Ct Angio Chest Aorta W/cm &/or Wo/cm  06/18/2013   CLINICAL DATA:  Central chest pain radiating to back, assess for dissection.  EXAM: CT ANGIOGRAPHY CHEST WITH CONTRAST  TECHNIQUE: Multidetector CT imaging of the chest was performed using the standard protocol during bolus administration of intravenous contrast. Multiplanar CT image reconstructions and MIPs were obtained to evaluate the vascular anatomy.   CONTRAST:  OMNIPAQUE IOHEXOL 350 MG/ML SOLN  COMPARISON:  Chest radiograph June 18, 2013  FINDINGS: Examination is timed for assessment of the pulmonary arteries. Main pulmonary artery is upper limits of normal in size. No pulmonary arterial filling defects to the subsegmental branches.  Thoracic aorta is normal in course and caliber without dissection, aneurysm, suspicious luminal irregularity, contrast extravasation or periaortic fluid collections. Normal 3 vessel aortic arch. The heart is moderate to severely enlarged, trace pericardial effusion. Prominent but not pathologically enlarged axillary lymph nodes may be reactive. 7 mm short axis aortopulmonary window lymph node.  Patchy ground-glass opacities within the upper lobes bilaterally, to lesser extent lingula and lower lobes with enhancing right lower lobe patchy airspace opacity and small right pleural effusion.  Included view of the abdomen is unremarkable. Mild osteopenia. Sub cm sclerotic lesion in a single mid thoracic level may reflect bone island. Thyromegaly, the isthmus is 14 mm.  Review of the MIP images confirms the above findings.  IMPRESSION: No acute pulmonary embolism. No aortic dissection or acute  vascular process.  Moderate to severe cardiomegaly and trace pericardial effusion. Small right pleural effusion with patchy ground-glass opacities which could reflect infectious or inflammatory process. Enhancing atelectasis right lung base.  Thyromegaly, consider correlation with thyroid function tests if clinically indicated.   Electronically Signed   By: Awilda Metro   On: 06/18/2013 03:45     EKG Interpretation   Date/Time:  Wednesday June 17 2013 22:18:26 EDT Ventricular Rate:  86 PR Interval:  140 QRS Duration: 80 QT Interval:  378 QTC Calculation: 452 R Axis:   9 Text Interpretation:  Normal sinus rhythm Possible Left atrial enlargement  Cannot rule out Anterior infarct , age undetermined Confirmed by  [facility]   MD, APRIL ([id number]) on 06/18/2013 3:11:19 AM      MDM   Final diagnoses:  Chest pain  Hyperthyroidism    58 year old female presents for chest pain radiating through to her back with associated lightheadedness, shortness of breath, cough, and palpitations. Patient also endorsing diaphoresis and diarrhea. Patient is a history of hyperthyroidism for which she is supposed to be taking methimazole and atenolol. Patient states that she has been noncompliant with her methimazole for the last month.  Doubt ACS given reassuring workup today. Patient worked up today for aortic dissection. CT shows no evidence of this. No evidence of pulmonary embolism. CT, however, does show moderate to severe cardiomegaly and trace pericardial effusion. Patient's BNP today is elevated to 5337. My concern, given patient's history of hyperthyroidism and medication noncompliance, is that her poorly managed hyperthyroidism is causing early signs of heart failure. TSH today is currently pending.  Have consulted with Family Medicine resident regarding patient case and discussed potential echo in AM for further evaluation of symptoms. FM to admit to [facility] for further work up and evaluation of symptoms today. EMTALA completed for transfer.   Filed Vitals:   06/18/13 0244 06/18/13 0300 06/18/13 0345 06/18/13 0537  BP: 127/67 135/72  109/73  Pulse: 93 98 94 96  Temp:      TempSrc:      Resp: 28 30 27 17   SpO2: 94% 96% 95% 97%       Antony Madura, PA-C 06/18/13 0545

## 2013-06-18 NOTE — H&P (Signed)
Seen and examined.  Chart reviewed.  Discussed with Dr. Sheral Apley.  Agree with her admit, management and documentation.  Briefly, 58 yo female with known hyperthyroidism (Graves Disease) off meds x 1 month.  Presents with sharp chest pain.  Issues: 1. Chest pain R/O MI.  Atypical pain in that she describes as sharp.  Does say worse with exertion.  She could have demand ischemia. 2. I am more worried about her DOE and cardiomegally.  She clinically has CHF which could be ischemic, idiopathic, high output or tachycardia induced.  The tachycardia induced is the most intriguing possibility - she has apparently had hyperthyroidism for years - see below. 3. Hyperthyroidism/graves disease.  Apparently going on for ~2 years.  At one point, weighed 180lbs.  Dropped to a low of 100lbs and finally dxed as hyperthyroid 03/2013.  Gained back to 120, but has been off her thyroid blocker x 1 month. 4. Likely gall bladder pathology.  Has epigastric/RUQ pain and elevated alk phos.  Beta blocker and ASA.  Put back on thyroid blocker.  Cards consult and echo.  Defer to cards for WU of the atypical CP.

## 2013-06-18 NOTE — Consult Note (Addendum)
Admit date: 06/17/2013 Referring Physician  Dr. Andria Frames Primary Physician  Dr. Berkley Harvey Primary Cardiologist  None Reason for Consultation  CP  HPI: Julie Ware is a 58 y.o. female presenting with chest pain. Patient states her chest pain started acutely yesterday around 4:00pm. Pain was constant, midchest, radiating at times to the neck, back and arm. States her pain was worse with exertion. Reports shortness of breath and fatigue assoicated with chest pain. No N/V or diaphoresis. Had similar episode last year. No other heart problems. Mother died of MI at age 34. Non-smoker. She does not have diabetes, HTN or HLD. She does have thyroid disease but has not taken her thyroid medication in over one month. No leg edema, but reports eye swelling, palpitations, dry skin. Patient diagnosed with Grave's disease earlier this year with hyperthyroidism. She was put on methimazole, but she has not taken in a while. Cardiac enzymes have been normal x 2.  Chest CT showed no PE or aortic dissection.  There was moderate to severe cardiomegaly and trace pericardial effusion. Small right pleural effusion with patchy ground-glass opacities which could reflect infectious or inflammatory process. Enhancing atelectasis right lung base.  2D echo is pending.  Cardiology is now asked to consult for further evaluation.  She states that she continues to have intermittent chest pain that is both sharp as well as a pressure sensation that lasts about 5 minutes and then resolves spontaneously.     PMH:   Past Medical History  Diagnosis Date  . Thyroid disease   . Anxiety      PSH:   Past Surgical History  Procedure Laterality Date  . Cesarean section      X 3 (1978, 1980, 1992)  . Tubal ligation  1992    Allergies:  Tylenol Prior to Admit Meds:   Prescriptions prior to admission  Medication Sig Dispense Refill  . ATENOLOL PO Take 1 tablet by mouth daily.      Marland Kitchen ibuprofen (ADVIL,MOTRIN) 200 MG tablet Take 800 mg  by mouth every 6 (six) hours as needed for pain.       Fam HX:    Family History  Problem Relation Age of Onset  . Diabetes Mother   . Hypertension Mother   . Hyperlipidemia Mother   . Hyperlipidemia Father   . Hypertension Father   . Diabetes Father   . Hypertension Paternal Grandmother   . Diabetes Paternal Grandmother   . Hypertension Maternal Grandfather   . Diabetes Maternal Grandfather    Social HX:    History   Social History  . Marital Status: Married    Spouse Name: N/A    Number of Children: N/A  . Years of Education: N/A   Occupational History  . Not on file.   Social History Main Topics  . Smoking status: Never Smoker   . Smokeless tobacco: Never Used  . Alcohol Use: No  . Drug Use: No  . Sexual Activity: Yes    Birth Control/ Protection: None   Other Topics Concern  . Not on file   Social History Narrative  . No narrative on file     ROS:  All 11 ROS were addressed and are negative except what is stated in the HPI  Physical Exam: Blood pressure 123/61, pulse 96, temperature 98.5 F (36.9 C), temperature source Oral, resp. rate 17, height _0  (1.6 m), weight 119 lb 8 oz (54.205 kg), SpO2 97.00%.    General: Well developed, well nourished, in  no acute distress Head: Eyes PERRLA, No xanthomas.   Normal cephalic and atramatic  Lungs:   Clear bilaterally to auscultation and percussion. Heart:   HRRR S1 S2 Pulses are 2+ & equal.S3 gallop is present            No carotid bruit. No JVD.  No abdominal bruits. No femoral bruits. Abdomen: Bowel sounds are positive, abdomen soft and non-tender without masses  Extremities:   No clubbing, cyanosis or edema.  DP +1 Neuro: Alert and oriented X 3. Psych:  Good affect, responds appropriately    Labs:   Lab Results  Component Value Date   WBC 5.8 06/17/2013   HGB 10.7* 06/17/2013   HCT 33.7* 06/17/2013   MCV 71.9* 06/17/2013   PLT 138* 06/17/2013     Recent Labs Lab 06/17/13 2357 06/18/13 0518  NA  145  --   K 4.2  --   CL 107  --   CO2 24  --   BUN 16  --   CREATININE 0.33*  --   CALCIUM 9.4  --   PROT  --  6.9  BILITOT  --  0.6  ALKPHOS  --  246*  ALT  --  28  AST  --  35  GLUCOSE 100*  --    No results found for this basename: PTT   No results found for this basename: INR,  PROTIME   Lab Results  Component Value Date   CKTOTAL 59 09/18/2011   TROPONINI <0.30 11/05/2012     No results found for this basename: CHOL   No results found for this basename: HDL   No results found for this basename: LDLCALC   No results found for this basename: TRIG   No results found for this basename: CHOLHDL   No results found for this basename: LDLDIRECT      Radiology:  Dg Chest 2 View  06/18/2013   CLINICAL DATA:  Chest pain and shortness of breath  EXAM: CHEST  2 VIEW  COMPARISON:  06/29/2012  FINDINGS: Cardiac enlargement. Pulmonary vascularity is normal. Interstitial changes probably fibrosis but early interstitial edema not excluded. No focal consolidation or airspace disease. No blunting of costophrenic angles.  IMPRESSION: Cardiac enlargement. Nonspecific mild interstitial changes. No focal consolidation.   Electronically Signed   By: Lucienne Capers M.D.   On: 06/18/2013 02:38   Ct Angio Chest Aorta W/cm &/or Wo/cm  06/18/2013   CLINICAL DATA:  Central chest pain radiating to back, assess for dissection.  EXAM: CT ANGIOGRAPHY CHEST WITH CONTRAST  TECHNIQUE: Multidetector CT imaging of the chest was performed using the standard protocol during bolus administration of intravenous contrast. Multiplanar CT image reconstructions and MIPs were obtained to evaluate the vascular anatomy.  CONTRAST:  143m OMNIPAQUE IOHEXOL 350 MG/ML SOLN  COMPARISON:  Chest radiograph June 18, 2013  FINDINGS: Examination is timed for assessment of the pulmonary arteries. Main pulmonary artery is upper limits of normal in size. No pulmonary arterial filling defects to the subsegmental branches.  Thoracic  aorta is normal in course and caliber without dissection, aneurysm, suspicious luminal irregularity, contrast extravasation or periaortic fluid collections. Normal 3 vessel aortic arch. The heart is moderate to severely enlarged, trace pericardial effusion. Prominent but not pathologically enlarged axillary lymph nodes may be reactive. 7 mm short axis aortopulmonary window lymph node.  Patchy ground-glass opacities within the upper lobes bilaterally, to lesser extent lingula and lower lobes with enhancing right lower lobe patchy airspace opacity and  small right pleural effusion.  Included view of the abdomen is unremarkable. Mild osteopenia. Sub cm sclerotic lesion in a single mid thoracic level may reflect bone island. Thyromegaly, the isthmus is 14 mm.  Review of the MIP images confirms the above findings.  IMPRESSION: No acute pulmonary embolism. No aortic dissection or acute vascular process.  Moderate to severe cardiomegaly and trace pericardial effusion. Small right pleural effusion with patchy ground-glass opacities which could reflect infectious or inflammatory process. Enhancing atelectasis right lung base.  Thyromegaly, consider correlation with thyroid function tests if clinically indicated.   Electronically Signed   By: Elon Alas   On: 06/18/2013 03:45    EKG:  NSR with rSR' in V1, cannot rule out anterior infarct age undetermined  ASSESSMENT:  1.  Chest pain with typical and atypical findings. Chest CT shows small pericardial effusion so this could be due to acute pericarditis.  She has not had any flu like symptoms except cough and chest xray shows possible infectious process which could also account for chest pain.  Her CRF include CAD at an early age in her mom.  EKG does not show any acute ST changes.  No evidence of PE on chest CT. CT shows moderate to severe Cardiomegaly so this could also represent acute myopericarditis, although typically the cardiac enzymes will be elevated  some. 2.  Recently diagnosed Grave's disease with hyperthyroidism 3.  Abnormal Chest CT with patchy ground glass opacities ? Infectious process 4.  Moderate to severe cardiomegaly on Chest CT - she does have an S3 gallop on exam  PLAN:   1.  Check 2D echo to assess LV sized and function and evaluate for pericardial effusion.  2.  CHeck CPK and ESR 3.  Thyroid studies pending 4.  Further workup based on results of 2D echo and labs 5.  Continue ASA/beta blocker 6.  Hold on full dose anticoagulation for now since pain is atypical and enzymes are normal, especially in setting of possible pericarditis. 7.  Check BNP  Sueanne Margarita, MD  06/18/2013  1:47 PM

## 2013-06-18 NOTE — ED Notes (Signed)
Patient transported to CT 

## 2013-06-18 NOTE — Progress Notes (Signed)
Echocardiogram 2D Echocardiogram has been performed.  Julie Ware 06/18/2013, 1:50 PM

## 2013-06-18 NOTE — H&P (Signed)
[facility] Admission History and Physical Service Pager: [phone]  Patient name: Julie Ware Medical record number: [id number] Date of birth: 02-10-1955 Age: 58 y.o. Gender: female  Primary Care Provider: Phill Myron, MD Consultants: Cardiology (pending) Code Status: full  Chief Complaint: chest pain  Assessment and Plan: Julie Ware is a 58 y.o. female presenting with chest pain. PMH is significant for untreated Grave's Disease.  # Chest pain- Patient with chest pain (HEART score 1 for moderately suspicious history, no risk factors). Pain improving currently. Work up with neg POCT troponin, but imaging reveals cardiomegaly, pericardial effusion and pleural effusion. - Admit to 3W, attending Dr. Andria Frames - Troponin x3 (serum) - Cardiac monitoring - Echo now - Cardiology consult - EKG prn worsening chest pain - ASA 325mg   # CHF- BNP elevated with pericardial and pleural effusions - Echo per above - No diuresis at this time as patient appears clinically euvolemic to dry   # Hyperthyroidism, untreated- Imaging earlier this year consistent with Grave's Disease. Not currently on any medication. TSH 0.008 at admission. - T3, T4 pending - Start Lopressor - Restart Methimazole 10mg  TID  # Abdominal pain- Noted on exam. Labs unremarkable except for elevated alk phos. - PPI - RUQ ultrasound - GI cocktail prn pain  # Fatigue- Most likely assoicated with other medical conditions - Monitor for improvement with treatment of chest pain and hyperthyroidism  FEN/GI: Heart healthy, low sodium diet. PPI Prophylaxis: Heparin SQ  Disposition: admit to 3W for chest pain. Dispo pending further work up and clinical improvement.  History of Present Illness: Julie Ware is a 58 y.o. female presenting with chest pain. Patient states her chest pain started acutely yesterday around 4:00pm. Pain was constant, midchest, radiates to pain at times. Some  radiation to neck and arm. States her pain was worse with exertion. Reports shortness of breath and fatigue assoicated with chest pain. No N/V or diaphoresis. Had similar episode last year. No other heart problems. Mother died of MI at age 52. Non-smoker. She does not have diabetes, HTN or HLD. She does have thyroid disease but has not taken her thyroid medication in over one month. No leg edema, but reports eye swelling, palpitations, dry skin.   Patient diagnosed with Grave's disease earlier this year with hyperthyroidism. She was put on methimazole, but she has not taken in a while.   Review Of Systems: Per HPI with the following additions: Leg weakness. Otherwise 12 point review of systems was performed and was unremarkable.  Patient Active Problem List   Diagnosis Date Noted  . Chest pain 06/18/2013  . Hyperthyroidism 01/30/2013   Past Medical History: Past Medical History  Diagnosis Date  . Thyroid disease   . Anxiety    Past Surgical History: Past Surgical History  Procedure Laterality Date  . Cesarean section      X 3 (1978, 1980, 1992)  . Tubal ligation  1992   Social History: History  Substance Use Topics  . Smoking status: Never Smoker   . Smokeless tobacco: Never Used  . Alcohol Use: No   Additional social history: None  Please also refer to relevant sections of EMR.  Family History: Family History  Problem Relation Age of Onset  . Diabetes Mother   . Hypertension Mother   . Hyperlipidemia Mother   . Hyperlipidemia Father   . Hypertension Father   . Diabetes Father   . Hypertension Paternal Grandmother   . Hypertension Maternal Grandfather   . Diabetes  Paternal Grandmother   . Diabetes Maternal Grandfather    Allergies and Medications: Allergies  Allergen Reactions  . Tylenol [Acetaminophen] Anaphylaxis and Rash   No current facility-administered medications on file prior to encounter.   Current Outpatient Prescriptions on File Prior to Encounter   Medication Sig Dispense Refill  . ibuprofen (ADVIL,MOTRIN) 200 MG tablet Take 800 mg by mouth every 6 (six) hours as needed for pain.        Objective: BP 119/55  Pulse 94  Temp(Src) 98.1 F (36.7 C) (Oral)  Resp 17  Ht $R'5\' 3"'Px$  (1.6 m)  Wt 119 lb 8 oz (54.205 kg)  BMI 21.17 kg/m2  SpO2 92% Exam: General: Thin AAF in bed, NAD. HEENT: AT, Center. Tachy mucus membranes. TTP of neck, no definite mass appreciated Cardiovascular: Tachycardia. Prominent PMI with bounding heart. 2/6 systolic murmur radiating to the neck Respiratory: No respiratory distress. CTAB Abdomen: +BS, TTP epigastrium and RUQ. No rebound Extremities: Moves all extremities. No edema noted Skin: Diffusely dry Neuro: AAO. No CN deficits. Global weakness noted  Labs and Imaging: CBC BMET   Recent Labs Lab 06/17/13 2357  WBC 5.8  HGB 10.7*  HCT 33.7*  PLT 138*    Recent Labs Lab 06/17/13 2357  NA 145  K 4.2  CL 107  CO2 24  BUN 16  CREATININE 0.33*  GLUCOSE 100*  CALCIUM 9.4    Dg Chest 2 View  06/18/2013   CLINICAL DATA:  Chest pain and shortness of breath  EXAM: CHEST  2 VIEW  COMPARISON:  06/29/2012  FINDINGS: Cardiac enlargement. Pulmonary vascularity is normal. Interstitial changes probably fibrosis but early interstitial edema not excluded. No focal consolidation or airspace disease. No blunting of costophrenic angles.  IMPRESSION: Cardiac enlargement. Nonspecific mild interstitial changes. No focal consolidation.   Electronically Signed   By: Lucienne Capers M.D.   On: 06/18/2013 02:38   Ct Angio Chest Aorta W/cm &/or Wo/cm  06/18/2013   CLINICAL DATA:  Central chest pain radiating to back, assess for dissection.  EXAM: CT ANGIOGRAPHY CHEST WITH CONTRAST  TECHNIQUE: Multidetector CT imaging of the chest was performed using the standard protocol during bolus administration of intravenous contrast. Multiplanar CT image reconstructions and MIPs were obtained to evaluate the vascular anatomy.  CONTRAST:   121mL OMNIPAQUE IOHEXOL 350 MG/ML SOLN  COMPARISON:  Chest radiograph June 18, 2013  FINDINGS: Examination is timed for assessment of the pulmonary arteries. Main pulmonary artery is upper limits of normal in size. No pulmonary arterial filling defects to the subsegmental branches.  Thoracic aorta is normal in course and caliber without dissection, aneurysm, suspicious luminal irregularity, contrast extravasation or periaortic fluid collections. Normal 3 vessel aortic arch. The heart is moderate to severely enlarged, trace pericardial effusion. Prominent but not pathologically enlarged axillary lymph nodes may be reactive. 7 mm short axis aortopulmonary window lymph node.  Patchy ground-glass opacities within the upper lobes bilaterally, to lesser extent lingula and lower lobes with enhancing right lower lobe patchy airspace opacity and small right pleural effusion.  Included view of the abdomen is unremarkable. Mild osteopenia. Sub cm sclerotic lesion in a single mid thoracic level may reflect bone island. Thyromegaly, the isthmus is 14 mm.  Review of the MIP images confirms the above findings.  IMPRESSION: No acute pulmonary embolism. No aortic dissection or acute vascular process.  Moderate to severe cardiomegaly and trace pericardial effusion. Small right pleural effusion with patchy ground-glass opacities which could reflect infectious or inflammatory process. Enhancing atelectasis  right lung base.  Thyromegaly, consider correlation with thyroid function tests if clinically indicated.   Electronically Signed   By: Elon Alas   On: 06/18/2013 03:45    Nikayla Madaris Fidel Levy, MD 06/18/2013, 11:37 AM PGY-3, [facility] pager: [phone], text pages welcome

## 2013-06-18 NOTE — ED Provider Notes (Signed)
Medical screening examination/treatment/procedure(s) were performed by non-physician practitioner and as supervising physician I was immediately available for consultation/collaboration.   EKG Interpretation   Date/Time:  Wednesday June 17 2013 22:18:26 EDT Ventricular Rate:  86 PR Interval:  140 QRS Duration: 80 QT Interval:  378 QTC Calculation: 452 R Axis:   9 Text Interpretation:  Normal sinus rhythm Possible Left atrial enlargement  Cannot rule out Anterior infarct , age undetermined Confirmed by  [facility]  MD, Nikitha Mode ([id number]) on 06/18/2013 3:11:19 AM       Mackson Botz Smitty Cords, MD 06/18/13 [id number]

## 2013-06-18 NOTE — Progress Notes (Signed)
[facility] provided pt with a list of primary care resources to help patient establish a pcp.  °

## 2013-06-19 DIAGNOSIS — E0590 Thyrotoxicosis, unspecified without thyrotoxic crisis or storm: Secondary | ICD-10-CM

## 2013-06-19 DIAGNOSIS — I509 Heart failure, unspecified: Secondary | ICD-10-CM

## 2013-06-19 LAB — TROPONIN I: Troponin I: <0.3 ng/mL (ref <0.30)

## 2013-06-19 LAB — PRO B NATRIURETIC PEPTIDE: PRO B NATRI PEPTIDE: 6175 pg/mL — AB (ref 0–125)

## 2013-06-19 LAB — SEDIMENTATION RATE: SED RATE: 11 mm/h (ref 0–22)

## 2013-06-19 LAB — CK TOTAL AND CKMB (NOT AT ARMC)
CK TOTAL: 27 U/L (ref 7–177)
CK, MB: 1 ng/mL (ref 0.3–4.0)
Relative Index: INVALID (ref 0.0–2.5)

## 2013-06-19 MED ORDER — METOPROLOL TARTRATE 50 MG PO TABS
50.0000 mg | ORAL_TABLET | Freq: Two times a day (BID) | ORAL | Status: DC
  Administered 2013-06-19 – 2013-06-20 (×3): 50 mg via ORAL
  Filled 2013-06-19 (×3): qty 1

## 2013-06-19 MED ORDER — LISINOPRIL 2.5 MG PO TABS
2.5000 mg | ORAL_TABLET | Freq: Every day | ORAL | Status: DC
  Administered 2013-06-19 – 2013-06-24 (×6): 2.5 mg via ORAL
  Filled 2013-06-19 (×6): qty 1

## 2013-06-19 NOTE — Progress Notes (Signed)
Patient Name: Julie Ware Date of Encounter: 06/19/2013     Active Problems:   Hyperthyroidism   Chest pain   Cardiomegaly    SUBJECTIVE  States she has been having some intermittent exertional CP for awhile. She works as Production designer, theatre/television/film at [employer]. She came in on 6/10 after starting to having sharp chest pain radiate to the back around 4pm at rest. Relieved when sit up and lean forward, worsen with lying on her back or exertion.  CURRENT MEDS . aspirin EC  325 mg Oral Daily  . heparin  5,000 Units Subcutaneous 3 times per day  . lisinopril  2.5 mg Oral Daily  . methimazole  10 mg Oral TID  . metoprolol tartrate  50 mg Oral BID  . pantoprazole  20 mg Oral Daily    OBJECTIVE  Filed Vitals:   06/18/13 1119 06/18/13 1333 06/18/13 2200 06/19/13 0451  BP: 119/55 123/61 126/67 129/76  Pulse: 94 96 103 101  Temp: 98.1 F (36.7 C) 98.5 F (36.9 C) 98.5 F (36.9 C) 98.6 F (37 C)  TempSrc: Oral Oral Oral Oral  Resp: 17 17 18 18   Height: 5\' 3"  (1.6 m)     Weight: 119 lb 8 oz (54.205 kg)     SpO2: 92% 97% 99% 95%    Intake/Output Summary (Last 24 hours) at 06/19/13 1141 Last data filed at 06/19/13 0900  Gross per 24 hour  Intake    490 ml  Output    750 ml  Net   -260 ml   Filed Weights   06/18/13 1119  Weight: 119 lb 8 oz (54.205 kg)    PHYSICAL EXAM  General: Pleasant, NAD. Neuro: Alert and oriented X 3. Moves all extremities spontaneously. Psych: Normal affect. HEENT:  Normal  Neck: Supple without bruits or JVD. Lungs:  Resp regular and unlabored, CTA with mildly decreased breath sound in R base Heart: Tachycardic. + S3 gallop. No s4, or murmurs. Abdomen: Soft,  non-distended, BS + x 4. + epigastric tenderness Extremities: No clubbing, cyanosis or edema. DP/PT/Radials 2+ and equal bilaterally.  Accessory Clinical Findings  CBC  Recent Labs  06/17/13 2357  WBC 5.8  HGB 10.7*  HCT 33.7*  MCV 71.9*  PLT 138*   Basic Metabolic Panel  Recent  Labs  16/10/96 2357  NA 145  K 4.2  CL 107  CO2 24  GLUCOSE 100*  BUN 16  CREATININE 0.33*  CALCIUM 9.4   Liver Function Tests  Recent Labs  06/18/13 0518  AST 35  ALT 28  ALKPHOS 246*  BILITOT 0.6  PROT 6.9  ALBUMIN 3.1*    Recent Labs  06/18/13 0518  LIPASE 17   Cardiac Enzymes  Recent Labs  06/18/13 1325 06/18/13 1930 06/18/13 2335  CKTOTAL  --   --  27  CKMB  --   --  1.0  TROPONINI <0.30 <0.30 <0.30   Thyroid Function Tests  Recent Labs  06/18/13 0225 06/18/13 1325  TSH 0.008*  --   T3FREE  --  >20.0*   BNP (last 3 results)  Recent Labs  06/17/13 2357 06/18/13 2335  PROBNP 5337.0* 6175.0*   TELE  Sinus rhythm, tachycardic with HR persistently in 90s.  ECG  NSR with HR 80s, poor R wave progression of anterior lead and T wave inversion in V1-V2  Radiology/Studies  Dg Chest 2 View  06/18/2013   CLINICAL DATA:  Chest pain and shortness of breath  EXAM: CHEST  2 VIEW  COMPARISON:  06/29/2012  FINDINGS: Cardiac enlargement. Pulmonary vascularity is normal. Interstitial changes probably fibrosis but early interstitial edema not excluded. No focal consolidation or airspace disease. No blunting of costophrenic angles.  IMPRESSION: Cardiac enlargement. Nonspecific mild interstitial changes. No focal consolidation.   Electronically Signed   By: Burman NievesWilliam  Stevens M.D.   On: 06/18/2013 02:38   Ct Angio Chest Aorta W/cm &/or Wo/cm  06/18/2013   CLINICAL DATA:  Central chest pain radiating to back, assess for dissection.  EXAM: CT ANGIOGRAPHY CHEST WITH CONTRAST  TECHNIQUE: Multidetector CT imaging of the chest was performed using the standard protocol during bolus administration of intravenous contrast. Multiplanar CT image reconstructions and MIPs were obtained to evaluate the vascular anatomy.  CONTRAST:  100mL OMNIPAQUE IOHEXOL 350 MG/ML SOLN  COMPARISON:  Chest radiograph June 18, 2013  FINDINGS: Examination is timed for assessment of the pulmonary  arteries. Main pulmonary artery is upper limits of normal in size. No pulmonary arterial filling defects to the subsegmental branches.  Thoracic aorta is normal in course and caliber without dissection, aneurysm, suspicious luminal irregularity, contrast extravasation or periaortic fluid collections. Normal 3 vessel aortic arch. The heart is moderate to severely enlarged, trace pericardial effusion. Prominent but not pathologically enlarged axillary lymph nodes may be reactive. 7 mm short axis aortopulmonary window lymph node.  Patchy ground-glass opacities within the upper lobes bilaterally, to lesser extent lingula and lower lobes with enhancing right lower lobe patchy airspace opacity and small right pleural effusion.  Included view of the abdomen is unremarkable. Mild osteopenia. Sub cm sclerotic lesion in a single mid thoracic level may reflect bone island. Thyromegaly, the isthmus is 14 mm.  Review of the MIP images confirms the above findings.  IMPRESSION: No acute pulmonary embolism. No aortic dissection or acute vascular process.  Moderate to severe cardiomegaly and trace pericardial effusion. Small right pleural effusion with patchy ground-glass opacities which could reflect infectious or inflammatory process. Enhancing atelectasis right lung base.  Thyromegaly, consider correlation with thyroid function tests if clinically indicated.   Electronically Signed   By: Awilda Metroourtnay  Bloomer   On: 06/18/2013 03:45   [place] Abdomen Limited Ruq  06/18/2013   CLINICAL DATA:  Chest pain  EXAM: US ABDOMEN LIMITED - RIGHT UPPER QUADRANT  COMPARISON:  None.  FINDINGS: Gallbladder:  No gallstones. The gallbladder wall is thickened. Negative Murphy sign. The wall measures up to 8 mm in thickness. Edema within the wall is present.  Common bile duct:  Diameter: 8 mm in caliber.  Liver:  Increased echogenicity throughout the liver. 9 mm simple cyst in the right lobe.  Additional findings: Right pleural effusion.  IMPRESSION:  There is nonspecific gallbladder wall thickening. No definite gallstones.  Increased echogenicity with the liver suggesting diffuse hepatic parenchymal disease or diffuse hepatic steatosis.  Common bile duct is dilated. Biliary obstruction is not excluded. Correlate with liver function tests.   Electronically Signed   By: Maryclare BeanArt  Hoss M.D.   On: 06/18/2013 21:37    ASSESSMENT AND PLAN  1. Chest pain - typical and atypical in nature  - relate to body position, better when sitting up, worse with exertion and lying down. Troponin negative.  - CT image shows moderate to severe cardiomegaly, pleural effusion and trace pericardial effusion  - Echo 06/18/2013 EF 35-40%, no pericardial effusion on echo  - originally concerned for acute pericarditis, no significant EKG change, trace pericardial effusion noted on CT, however no pericardial effusion noted on echo. Her chest discomfort does  improve with sitting forward. However patient also states she has been noticing chest pain with exertion during work relieved with rest, ?if need ischemic workup now echo shows decreased EF or is patient's sx related to pericarditis? Will discuss with Dr. Tresa Endo  2. Acute systolic HF  - proBNP >5000  - unclear if due to tachycardia vs ischemia  - Echo 06/18/2013 EF 35-40%, diffuse hypokinesis, moderate AR, MR, TR and PR. Mildly dilated LA, mildly to moderately dilated RV, normal LV size, moderate pulm HTN PA peak pressure   3. Hyperthyroidism 2/2 Grave's dx: diagnosed in Jan 2015  - did not take methimazole in last month due to inability to schedule follow up to refill medication  - Free T3 elevated >20, Free T4 >6  - continue metoprolol for rate control  4. Epigastric pain  - worse with palpation  - U/S shows dilated biliary duct. LFT normal  5. Abnormal Chest CT with patchy ground glass opacities ? Infectious process   Leonia Reeves PA Pager: [phone]   Patient seen and examined. Agree with  assessment and plan. Pt is hyperthyroid and has not taken her methimazole for last month. Chest pain is sharp and pleuritic and does not seem to be ischemic. Diffuse hypokinesis noted on echo.  Acute systolic and diastolic CHF with BNP 6175. Tachycardic with P ~100.  Metoprolol increased to 50 mg bid, titrate as BP and HR allow.  Start lisinopril 2.5 today and titrate.  Consider endo eval.   Lennette Bihari, MD, [facility] 06/19/2013 11:41 AM

## 2013-06-19 NOTE — Progress Notes (Signed)
Family Medicine Teaching Service Daily Progress Note Intern Pager: [phone]  Patient name: Julie Ware Medical record number: [id number] Date of birth: [birth year] Age: 58 y.o. Gender: female  Primary Care Provider: Phill Myron, MD Consultants: Cardiology Code Status: Full code  Pt Overview and Major Events to Date:  6/11: patient admitted  Assessment and Plan: Julie Ware is a 58 y.o. female presenting with chest pain. PMH is significant for untreated Grave's Disease.   # Chest pain- Patient with chest pain (HEART score 1 for moderately suspicious history, no risk factors). Pain improving currently. Imaging reveals cardiomegaly, pericardial effusion and pleural effusion. Free T3 elevated at >20 and free T4 elevated at 6.66. CP, CPK normal. - Cardiac monitoring  - follow-up echo - Cardiology consult - EKG prn worsening chest pain  - ASA 325mg    # CHF- BNP elevated with pericardial and pleural effusions  - Echo per above  - No diuresis at this time as patient appears clinically euvolemic to dry   # Hyperthyroidism, untreated - Imaging earlier this year consistent with Grave's Disease. Not currently on any medication. TSH 0.008 at admission.  - Continue Lopressor  - Continue Methimazole 10mg  TID   # Abdominal pain - Noted on exam. Labs unremarkable except for elevated alk phos. Ultrasound significant for gallbladder thickening, common bile duct dilation and diffuse live parenchymal disease/steatosis. AST/ALT wnl. Could have PBC vs PSC. Would be worth testing - anti-mitochondrial antibody - consider consult GI for MRCP - PPI - Hepatitis panel - Fasting lipid panel - GI cocktail prn pain   # Fatigue- Most likely assoicated with other medical conditions  - Monitor for improvement with treatment of chest pain and hyperthyroidism   FEN/GI: Heart healthy, low sodium diet. PPI  Prophylaxis: Heparin SQ  Disposition: pending clinical improvement  Subjective:  Patient  feels better than yesterday but is still having abdominal pain. Is also feeling jittery at times but says it is normal for her. Had some chest pain earlier this morning and is having shortness of breath especially when she lays down flat and stands up to walk around.  Objective: Temp:  [97.8 F (36.6 C)-98.6 F (37 C)] 98.6 F (37 C) (06/12 0451) Pulse Rate:  [92-103] 101 (06/12 0451) Resp:  [17-21] 18 (06/12 0451) BP: (110-129)/(41-76) 129/76 mmHg (06/12 0451) SpO2:  [92 %-99 %] 95 % (06/12 0451) Weight:  [119 lb 8 oz (54.205 kg)] 119 lb 8 oz (54.205 kg) (06/11 1119)  Physical Exam: General: Sitting up in bed, looks fidgety, in no acute distress Cardiovascular: Regular rate and rhythm, 3/6 systolic murmur heard best at LUSB with radiation to carotids Respiratory: Clear to auscultation bilaterally, no wheezing, no crackles, slightly diminished throughout Abdomen: Soft, RUQ and epigastric tenderness Extremities: No cyanosis, no edema  Laboratory:  Recent Labs Lab 06/17/13 2357  WBC 5.8  HGB 10.7*  HCT 33.7*  PLT 138*    Recent Labs Lab 06/17/13 2357 06/18/13 0518  NA 145  --   K 4.2  --   CL 107  --   CO2 24  --   BUN 16  --   CREATININE 0.33*  --   CALCIUM 9.4  --   PROT  --  6.9  BILITOT  --  0.6  ALKPHOS  --  246*  ALT  --  28  AST  --  35  GLUCOSE 100*  --    TSH  Date Value Ref Range Status  06/18/2013 0.008* 0.350 - 4.500 uIU/mL Final  Performed at [facility]     Free T4  Date Value Ref Range Status  06/18/2013 6.66* 0.80 - 1.80 ng/dL Final     Performed at [facility]     T3, Free  Date Value Ref Range Status  06/18/2013 >20.0* 2.3 - 4.2 pg/mL Final     Performed at [facility]    Imaging/Diagnostic Tests: Dg Chest 2 View  06/18/2013   CLINICAL DATA:  Chest pain and shortness of breath  EXAM: CHEST  2 VIEW  COMPARISON:  06/29/2012  FINDINGS: Cardiac enlargement. Pulmonary vascularity is normal. Interstitial changes  probably fibrosis but early interstitial edema not excluded. No focal consolidation or airspace disease. No blunting of costophrenic angles.  IMPRESSION: Cardiac enlargement. Nonspecific mild interstitial changes. No focal consolidation.   Electronically Signed   By: Lucienne Capers M.D.   On: 06/18/2013 02:38   Ct Angio Chest Aorta W/cm &/or Wo/cm  06/18/2013   CLINICAL DATA:  Central chest pain radiating to back, assess for dissection.  EXAM: CT ANGIOGRAPHY CHEST WITH CONTRAST  TECHNIQUE: Multidetector CT imaging of the chest was performed using the standard protocol during bolus administration of intravenous contrast. Multiplanar CT image reconstructions and MIPs were obtained to evaluate the vascular anatomy.  CONTRAST:  133mL OMNIPAQUE IOHEXOL 350 MG/ML SOLN  COMPARISON:  Chest radiograph June 18, 2013  FINDINGS: Examination is timed for assessment of the pulmonary arteries. Main pulmonary artery is upper limits of normal in size. No pulmonary arterial filling defects to the subsegmental branches.  Thoracic aorta is normal in course and caliber without dissection, aneurysm, suspicious luminal irregularity, contrast extravasation or periaortic fluid collections. Normal 3 vessel aortic arch. The heart is moderate to severely enlarged, trace pericardial effusion. Prominent but not pathologically enlarged axillary lymph nodes may be reactive. 7 mm short axis aortopulmonary window lymph node.  Patchy ground-glass opacities within the upper lobes bilaterally, to lesser extent lingula and lower lobes with enhancing right lower lobe patchy airspace opacity and small right pleural effusion.  Included view of the abdomen is unremarkable. Mild osteopenia. Sub cm sclerotic lesion in a single mid thoracic level may reflect bone island. Thyromegaly, the isthmus is 14 mm.  Review of the MIP images confirms the above findings.  IMPRESSION: No acute pulmonary embolism. No aortic dissection or acute vascular process.   Moderate to severe cardiomegaly and trace pericardial effusion. Small right pleural effusion with patchy ground-glass opacities which could reflect infectious or inflammatory process. Enhancing atelectasis right lung base.  Thyromegaly, consider correlation with thyroid function tests if clinically indicated.   Electronically Signed   By: Elon Alas   On: 06/18/2013 03:45   US Abdomen Limited Ruq  06/18/2013   CLINICAL DATA:  Chest pain  EXAM: US ABDOMEN LIMITED - RIGHT UPPER QUADRANT  COMPARISON:  None.  FINDINGS: Gallbladder:  No gallstones. The gallbladder wall is thickened. Negative Murphy sign. The wall measures up to 8 mm in thickness. Edema within the wall is present.  Common bile duct:  Diameter: 8 mm in caliber.  Liver:  Increased echogenicity throughout the liver. 9 mm simple cyst in the right lobe.  Additional findings: Right pleural effusion.  IMPRESSION: There is nonspecific gallbladder wall thickening. No definite gallstones.  Increased echogenicity with the liver suggesting diffuse hepatic parenchymal disease or diffuse hepatic steatosis.  Common bile duct is dilated. Biliary obstruction is not excluded. Correlate with liver function tests.   Electronically Signed   By: Duffy Rhody.D.  On: 06/18/2013 21:37    Cordelia Poche, MD 06/19/2013, 7:32 AM PGY-1, [place] Intern pager: [phone], text pages welcome

## 2013-06-19 NOTE — Progress Notes (Signed)
Seen and examined.  Discussed with Dr. Lonny Prude.  Agree with his documentation and management.  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  Clearly hyperthyroid - In rounds we decided to pursue the treatment of I131 ablation.  Has some Rt UQ abd pain, increased alk phos and abnormal liver/gallbladder on ultrasound.  Workup in progress.  We will push Beta blocker and add a whiff of ACE.

## 2013-06-20 ENCOUNTER — Inpatient Hospital Stay (HOSPITAL_COMMUNITY): Payer: PRIVATE HEALTH INSURANCE

## 2013-06-20 ENCOUNTER — Encounter (HOSPITAL_COMMUNITY): Payer: Self-pay | Admitting: Physician Assistant

## 2013-06-20 DIAGNOSIS — R945 Abnormal results of liver function studies: Secondary | ICD-10-CM

## 2013-06-20 DIAGNOSIS — R932 Abnormal findings on diagnostic imaging of liver and biliary tract: Secondary | ICD-10-CM

## 2013-06-20 DIAGNOSIS — R079 Chest pain, unspecified: Secondary | ICD-10-CM

## 2013-06-20 LAB — LIPID PANEL
Cholesterol: 112 mg/dL (ref 0–200)
HDL: 41 mg/dL (ref >39)
LDL CALC: 61 mg/dL (ref 0–99)
Total CHOL/HDL Ratio: 2.7 RATIO
Triglycerides: 50 mg/dL (ref <150)
VLDL: 10 mg/dL (ref 0–40)

## 2013-06-20 LAB — RETICULOCYTES
RBC.: 4.65 MIL/uL (ref 3.87–5.11)
RETIC CT PCT: 1.3 % (ref 0.4–3.1)
Retic Count, Absolute: 60.5 10*3/uL (ref 19.0–186.0)

## 2013-06-20 LAB — HEPATITIS C ANTIBODY (REFLEX): HCV AB: NEGATIVE

## 2013-06-20 MED ORDER — GADOBENATE DIMEGLUMINE 529 MG/ML IV SOLN
12.0000 mL | Freq: Once | INTRAVENOUS | Status: AC | PRN
  Administered 2013-06-20: 12 mL via INTRAVENOUS

## 2013-06-20 MED ORDER — METOPROLOL TARTRATE 100 MG PO TABS
100.0000 mg | ORAL_TABLET | Freq: Two times a day (BID) | ORAL | Status: DC
  Administered 2013-06-20 – 2013-06-22 (×4): 100 mg via ORAL
  Filled 2013-06-20 (×5): qty 1

## 2013-06-20 NOTE — Consult Note (Signed)
[facility] Consult: 12:48 PM 06/20/2013  LOS: 3 days    Referring Provider: Dr Andria Frames  Primary Care Physician: care throu [facility] on [address].  Primary Gastroenterologist:  Althia Forts.   Consult as weekend coverage for Dr Carol Ada  Reason for Consultation:  Dilated CBD, parenchymal liver disease   HPI: Julie Ware is a 58 y.o. female.  Hx Graves disease diagnosed Jan 2015. Non-compliant with thyroid meds (methimazole) for> one month.  Says she ran out and, despite calls to clinic, no refills were provided.     Admitted 2 days ago with atypical chest pain, MI rule out.  Cardiology comfirms diagnoses of cardiomyopathy that may be due to untreated hyperthyroidism and tachycardia.  No plans to work up for possible ischemia until hyperthyroidism is treated.  Cardiac enzymes negative.   Having RUQ pain.   Chest CTA  Chest with cardiomegaly, thyromegaly.  Limited ultrasound abdomen shows 8 mm CBD biliary obstruction not excluded. , nonspecific gallbladder wall thickening. No definite gallstones, increased echogenicity with the liver suggesting diffuse hepatic parenchymal disease or diffuse hepatic steatosis. + pleural effusion.  Her transaminases are not elevated but alk phos is in 218 to 254.  Bilirubin is normal. Lipase normal.  No previous labs for comparison.  She has microcytic anemia, present as far back as 09/2011.  Was advised to take po Iron in past but never did.  At the time she had menorrhagia.  Platelets are low (nadir 99). No coags or FOBT tests thus far.  Denies previous hx of liver disease.  Never drank much ETOH, essentially non-drinker.  No hx of ilicit drug use. + 12 months anorexia without nausea.  75 to 80 # weight loss.  No extremity edema.  No bilious urine.  No abdominal  pain.  Chest pain in mid to lower sternum, tends to occur when she lays down and relived when she rises. Menopause as of ~ age 63.  No vaginal bleedng No NSAIDS or ASA. 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  Working until day of admission.  + weakness and minor falls.  No syncope.     Past Medical History  Diagnosis Date  . Thyroid disease   . Anxiety     Past Surgical History  Procedure Laterality Date  . Cesarean section      X 3 (1978, 1980, 1992)  . Tubal ligation  1992    Prior to Admission medications   Medication Sig Start Date End Date Taking? Authorizing Provider  ATENOLOL PO Take 1 tablet by mouth daily.   Yes Historical Provider, MD  ibuprofen (ADVIL,MOTRIN) 200 MG tablet Take 800 mg by mouth every 6 (six) hours as needed for pain.   Yes Historical Provider, MD    Scheduled Meds: . aspirin EC  325 mg Oral Daily  . heparin  5,000 Units Subcutaneous 3 times per day  . lisinopril  2.5 mg Oral Daily  . methimazole  10 mg Oral TID  . metoprolol tartrate  100 mg Oral BID  . pantoprazole  20 mg  Oral Daily   Infusions: . sodium chloride Stopped (06/18/13 2022)   PRN Meds: gi cocktail, morphine injection   Allergies as of 06/17/2013  . (No Known Allergies)    Family History  Problem Relation Age of Onset  . Diabetes Mother   . Hypertension Mother   . Hyperlipidemia Mother   . Hyperlipidemia Father   . Hypertension Father   . Diabetes Father   . Hypertension Paternal Grandmother   . Diabetes Paternal Grandmother   . Hypertension Maternal Grandfather   . Diabetes Maternal Grandfather     History   Social History  . Marital Status: Married    Spouse Name: N/A    Number of Children: N/A  . Years of Education: N/A        No literacy problems.   Occupational History  . Freight forwarder of a [employer]   Social History Main Topics  . Smoking status: Never Smoker   . Smokeless tobacco: Never Used  . Alcohol Use: No  . Drug Use: No  . Sexual Activity: Yes     Birth Control/ Protection: None.  Menopause since age 16   Other Topics Concern  . Not on file   Social History Narrative  . No narrative on file    REVIEW OF SYSTEMS: Constitutional:  Per HPI ENT:  No nose bleeds Pulm:  No cough.  + DOE.  CV:  No palpitations, no LE edema.  GU:  No hematuria, no frequency GI:  Per HPI Heme:  Per HPI   Transfusions:  None ever Neuro:  No headaches, no peripheral tingling or numbness Derm:  No itching, no rash or sores.  Endocrine:  + chills.  No polyuria or dysuria Immunization:  Not queried Travel:  None beyond local counties in last few months.    PHYSICAL EXAM: Vital signs in last 24 hours: Filed Vitals:   06/20/13 0959  BP: 107/64  Pulse: 96  Temp:   Resp:    Wt Readings from Last 3 Encounters:  06/20/13 54.432 kg (120 lb)  03/23/13 53.162 kg (117 lb 3.2 oz)  02/09/13 52.164 kg (115 lb)   General: pleasant, slightly tremulous AAF.  Comfortable.  Looks chronically ill and cachectic Head:  No swelling or asymmetry  Eyes:  No icterus or pallor Ears:  Not HOH  Nose:  No congestion or discharge Mouth:  Clear, moist oral mm.   Neck:  No mass no JVD.  Lungs:  Clear, no labored breathing Heart: RRR.  +8/3 systolic murmer Abdomen:  Soft, thin, NT, ND.  No HSM or mass.  BS active.   Rectal: not performed   Musc/Skeltl: no joint swelling or deformity Extremities:  No CCE  Neurologic:  Pleasant, cooperative, timid  Skin:  No rash, no sores Tattoos:  none Nodes:  No cervical adenopathy   Psych:  Cooperative, slightly anxious (tremulousness contributes to this impression).  Engaged.  Able to provide good history.   Intake/Output from previous day: 06/12 0701 - 06/13 0700 In: 840 [P.O.:840] Out: -  Intake/Output this shift:    LAB RESULTS:  Recent Labs  06/17/13 2357  WBC 5.8  HGB 10.7*  HCT 33.7*  PLT 138*  MCV 68   BMET Lab Results  Component Value Date   NA 145 06/17/2013   NA 140 03/23/2013   NA 138  01/18/2013   K 4.2 06/17/2013   K 3.3* 03/23/2013   K 3.8 01/18/2013   CL 107 06/17/2013   CL 107 03/23/2013  CL 100 01/18/2013   CO2 24 06/17/2013   CO2 26 03/23/2013   CO2 22 01/18/2013   GLUCOSE 100* 06/17/2013   GLUCOSE 125* 03/23/2013   GLUCOSE 64* 01/18/2013   BUN 16 06/17/2013   BUN 12 03/23/2013   BUN 17 01/18/2013   CREATININE 0.33* 06/17/2013   CREATININE <0.30* 03/23/2013   CREATININE 0.31* 01/18/2013   CALCIUM 9.4 06/17/2013   CALCIUM 8.8 03/23/2013   CALCIUM 10.0 01/18/2013   LFT  Recent Labs  06/18/13 0518  PROT 6.9  ALBUMIN 3.1*  AST 35  ALT 28  ALKPHOS 246*  BILITOT 0.6  BILIDIR <0.2  IBILI NOT CALCULATED   PT/INR No results found for this basename: INR, PROTIME   Hepatitis Panel No results found for this basename: HEPBSAG, HCVAB, HEPAIGM, HEPBIGM,  in the last 72 hours C-Diff No components found with this basename: cdiff   Lipase     Component Value Date/Time   LIPASE 17 06/18/2013 0518    Drugs of Abuse     Component Value Date/Time   LABOPIA NEG 01/30/2013 1653   COCAINSCRNUR NEG 01/30/2013 1653   LABBENZ NEG 01/30/2013 1653   AMPHETMU NEG 01/30/2013 1653     RADIOLOGY STUDIES: US Abdomen Limited Ruq 06/18/2013   CLINICAL DATA:  Chest pain  EXAM: US ABDOMEN LIMITED - RIGHT UPPER QUADRANT  COMPARISON:  None.  FINDINGS: Gallbladder:  No gallstones. The gallbladder wall is thickened. Negative Murphy sign. The wall measures up to 8 mm in thickness. Edema within the wall is present.  Common bile duct:  Diameter: 8 mm in caliber.  Liver:  Increased echogenicity throughout the liver. 9 mm simple cyst in the right lobe.  Additional findings: Right pleural effusion.  IMPRESSION: There is nonspecific gallbladder wall thickening. No definite gallstones.  Increased echogenicity with the liver suggesting diffuse hepatic parenchymal disease or diffuse hepatic steatosis.  Common bile duct is dilated. Biliary obstruction is not excluded. Correlate with liver function tests.    Electronically Signed   By: Maryclare Bean M.D.   On: 06/18/2013 21:37    ENDOSCOPIC STUDIES: *  None ever  IMPRESSION:   *  Graves disease, out of control hyperthyroidism in setting of non med compliance  *  Parenchymal liver disease, ? Steatosis/cirrhosis. Dilated CBD without obvious obstructing stones or mass.  *  Microcytic anemia, longstanding.    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     PLAN:     *  Anemia panel.  FOBT test. *  MRCP, ordered. Tech says should be able to complete this today.    [name]  06/20/2013, 12:48 PM Pager: [phone]  GI Attending Note   Chart was reviewed and patient was examined. X-rays and lab were reviewed.    I agree with management and plans.  In plan of moderately severe, intermittent right upper quadrant pain radiating to the back.  Cholestatic LFTs and slight common bile duct dilatation raise the question of biliary obstruction.  Would proceed with MRCP.  Sandy Salaam. Deatra Ina, M.D., [facility] Cell [phone]

## 2013-06-20 NOTE — Progress Notes (Signed)
Seen and examined.  Agree with Dr. Aviva Signs.  See my separate progress note.

## 2013-06-20 NOTE — Progress Notes (Signed)
Seen and examined.  Feeling better each day.  We are approaching DC.  Remaining issues: 1. Better rate control.  Still a bit tachy.  BP tolerating metoprolol.  Will increase to 100 bid 2. New cardiomyopathy - likely tachycardia induced.  Does cards have any further plans for inpatient testing to R/O ischemic cardiomyopathy? 3. We were going to check with Radiology to expedite I131 Rx of hyperthyroidism.  Do we have a plan in place so that this can occur promptly post DC?

## 2013-06-20 NOTE — Progress Notes (Addendum)
Subjective:  She has no complaints of chest pain today. No pleuritic chest pain and no ischemic type chest pain.  Objective:  Vital Signs in the last 24 hours: BP 107/64  Pulse 96  Temp(Src) 99.3 F (37.4 C) (Oral)  Resp 16  Ht 5\' 3"  (1.6 m)  Wt 54.432 kg (120 lb)  BMI 21.26 kg/m2  SpO2 95%  Physical Exam: Thin black female currently in no acute distress Lungs:  Clear  Cardiac:  Regular rhythm, normal S1 and S2, no S3 Extremities:  No edema present  Intake/Output from previous day: 06/12 0701 - 06/13 0700 In: 840 [P.O.:840] Out: -  Weight Filed Weights   06/18/13 1119 06/20/13 0500  Weight: 54.205 kg (119 lb 8 oz) 54.432 kg (120 lb)    Lab Results: Basic Metabolic Panel:  Recent Labs  41/93/79 2357  NA 145  K 4.2  CL 107  CO2 24  GLUCOSE 100*  BUN 16  CREATININE 0.33*    CBC:  Recent Labs  06/17/13 2357  WBC 5.8  HGB 10.7*  HCT 33.7*  MCV 71.9*  PLT 138*    BNP    Component Value Date/Time   PROBNP 6175.0* 06/18/2013 2335   Telemetry:  Sinus rhythm  Assessment/Plan:  1. Cardiomyopathy-may be due to hyperthyroidism and tachycardia 2. Hyperthyroidism currently getting back on treatment 3. Chest pain was resolved atypical by description  Recommendations:  She has no chest pain at the present time. The pain by description was atypical previously and would defer ischemic workup until her hyperthyroidism is treated. Agree with increasing beta blockers. Add ACE inhibitor.   Darden Palmer  MD [facility]  06/20/2013, 10:59 AM

## 2013-06-20 NOTE — Progress Notes (Signed)
Family Medicine Teaching Service Daily Progress Note Intern Pager: [phone]  Patient name: Julie Ware Medical record number: [id number] Date of birth: 22-Jan-1955 Age: 58 y.o. Gender: female  Primary Care Provider: Wenda Low, MD Consultants: Cardiology Code Status: Full code  Pt Overview and Major Events to Date:  6/11: patient admitted 6/12: ECHO:Difuse hypokinesis, systolic (EF 35-40%)and diastolic CHF (without pericardial effusion seen)  Assessment and Plan: Julie Ware is a 58 y.o. female presenting with chest pain. PMH is significant for untreated Grave's Disease.   # Chest pain- Patient with chest pain (HEART score 1 for moderately suspicious history, no risk factors).  Imaging reveals cardiomegaly, pericardial effusion and pleural effusion, but ECHO does not show the Pericardial effusion. Free T3 elevated at >20 and free T4 elevated at 6.66. CP, CPK normal. - Negative Troponin X3. Cardiology consulted does not think CP is ischemic in nature. Metoprolol increased and started on Lisinopril  - Continue cardiac monitoring  - EKG prn worsening chest pain  - ASA 325mg    # CHF- BNP elevated with pericardial and pleural effusions not seen on ECHO. - No diuresis at this time as patient appears clinically euvolemic to dry   # Hyperthyroidism, untreated - Imaging earlier this year consistent with Grave's Disease. Not currently on any medication. TSH 0.008 at admission.  - Continue Metoprolol and titrate up to control tachycardia as BP allows - Continue Methimazole 10mg  TID   # Abdominal pain - Noted on exam. Labs unremarkable except for elevated alk phos. Ultrasound significant for gallbladder thickening, common bile duct dilation and diffuse live parenchymal disease/steatosis. AST/ALT wnl. Could have PBC vs PSC. Would be worth testing - anti-mitochondrial antibody, hep panel pending. -  Will consult GI for consideration of  MRCP - PPI - GI cocktail prn pain   # Fatigue-  Most likely assoicated with other medical conditions  - Monitor for improvement with treatment of chest pain and hyperthyroidism   FEN/GI: Heart healthy, low sodium diet. PPI  Prophylaxis: Heparin SQ  Disposition: pending clinical improvement  Subjective:  Patient feels better than yesterday but is still having abdominal pain. Denies chest pain today. No N/V.   Objective: Temp:  [98.5 F (36.9 C)-99.3 F (37.4 C)] 99.3 F (37.4 C) (06/13 0500) Pulse Rate:  [90-101] 101 (06/13 0500) Resp:  [16-19] 16 (06/12 2305) BP: (108-123)/(56-74) 123/64 mmHg (06/13 0500) SpO2:  [95 %-100 %] 95 % (06/13 0500) Weight:  [120 lb (54.432 kg)] 120 lb (54.432 kg) (06/13 0500)  Physical Exam: General: lying in bed, NAD Cardiovascular: Regular rhythm, mild tachycardia. No murmurs or gallops heard on auscultation.  Respiratory: Clear to auscultation bilaterally, no wheezing, no crackles. Abdomen: Soft, RUQ and epigastric tenderness, without guarding or rebound tenderness.  Extremities: No cyanosis, no edema  Laboratory:  Recent Labs Lab 06/17/13 2357  WBC 5.8  HGB 10.7*  HCT 33.7*  PLT 138*    Recent Labs Lab 06/17/13 2357 06/18/13 0518  NA 145  --   K 4.2  --   CL 107  --   CO2 24  --   BUN 16  --   CREATININE 0.33*  --   CALCIUM 9.4  --   PROT  --  6.9  BILITOT  --  0.6  ALKPHOS  --  246*  ALT  --  28  AST  --  35  GLUCOSE 100*  --    TSH  Date Value Ref Range Status  06/18/2013 0.008* 0.350 - 4.500 uIU/mL Final  Performed at [facility]     Free T4  Date Value Ref Range Status  06/18/2013 6.66* 0.80 - 1.80 ng/dL Final     Performed at [facility]     T3, Free  Date Value Ref Range Status  06/18/2013 >20.0* 2.3 - 4.2 pg/mL Final     Performed at [facility]    Imaging/Diagnostic Tests: US Abdomen Limited Ruq  06/18/2013   CLINICAL DATA:  Chest pain  EXAM: US ABDOMEN LIMITED - RIGHT UPPER QUADRANT  COMPARISON:  None.  FINDINGS:  Gallbladder:  No gallstones. The gallbladder wall is thickened. Negative Murphy sign. The wall measures up to 8 mm in thickness. Edema within the wall is present.  Common bile duct:  Diameter: 8 mm in caliber.  Liver:  Increased echogenicity throughout the liver. 9 mm simple cyst in the right lobe.  Additional findings: Right pleural effusion.  IMPRESSION: There is nonspecific gallbladder wall thickening. No definite gallstones.  Increased echogenicity with the liver suggesting diffuse hepatic parenchymal disease or diffuse hepatic steatosis.  Common bile duct is dilated. Biliary obstruction is not excluded. Correlate with liver function tests.   Electronically Signed   By: Maryclare Bean M.D.   On: 06/18/2013 21:37    Eldor Conaway [place] de Gwendalyn Ege, MD 06/20/2013, 8:54 AM PGY-3, [place] Intern pager: [phone], text pages welcome

## 2013-06-21 DIAGNOSIS — I420 Dilated cardiomyopathy: Secondary | ICD-10-CM

## 2013-06-21 DIAGNOSIS — R932 Abnormal findings on diagnostic imaging of liver and biliary tract: Secondary | ICD-10-CM

## 2013-06-21 DIAGNOSIS — R945 Abnormal results of liver function studies: Secondary | ICD-10-CM

## 2013-06-21 LAB — FERRITIN: Ferritin: 302 ng/mL — ABNORMAL HIGH (ref 10–291)

## 2013-06-21 LAB — GAMMA GT: GGT: 59 U/L — ABNORMAL HIGH (ref 7–51)

## 2013-06-21 LAB — IRON AND TIBC
Iron: 33 ug/dL — ABNORMAL LOW (ref 42–135)
SATURATION RATIOS: 17 % — AB (ref 20–55)
TIBC: 198 ug/dL — ABNORMAL LOW (ref 250–470)
UIBC: 165 ug/dL (ref 125–400)

## 2013-06-21 LAB — VITAMIN B12: Vitamin B-12: 658 pg/mL (ref 211–911)

## 2013-06-21 LAB — FOLATE: Folate: 18.6 ng/mL

## 2013-06-21 MED ORDER — FERROUS SULFATE 325 (65 FE) MG PO TABS
325.0000 mg | ORAL_TABLET | Freq: Three times a day (TID) | ORAL | Status: DC
  Administered 2013-06-21 – 2013-06-24 (×10): 325 mg via ORAL
  Filled 2013-06-21 (×14): qty 1

## 2013-06-21 NOTE — Progress Notes (Signed)
Daily Rounding Note For Dr Benson Norway  06/21/2013, 11:29 AM  LOS: 4 days   SUBJECTIVE:       Feels well.  Eating without problems.  No pain or nausea.  No chest pain or dyspnea  OBJECTIVE:         Vital signs in last 24 hours:    Temp:  [98.8 F (37.1 C)-99.4 F (37.4 C)] 98.9 F (37.2 C) (06/14 0505) Pulse Rate:  [62-89] 85 (06/14 0505) Resp:  [18] 18 (06/14 0505) BP: (96-105)/(54-66) 100/57 mmHg (06/14 0957) SpO2:  [96 %-98 %] 96 % (06/14 0505) Weight:  [54.3 kg (119 lb 11.4 oz)] 54.3 kg (119 lb 11.4 oz) (06/14 0505) Last BM Date: 06/20/13 General: looks thin, comfortable   Heart: RRR Chest: clear bil.  Unlabored breathing Abdomen: soft, NT, ND.  Active BS  Extremities: no CCE Neuro/Psych:  Pleasant, alert, relaxed, cooperative.  Fluent speech.   Intake/Output from previous day: 06/13 0701 - 06/14 0700 In: 240 [P.O.:240] Out: -   Intake/Output this shift:    Lab Results: No results found for this basename: WBC, HGB, HCT, PLT,  in the last 72 hours BMET No results found for this basename: NA, K, CL, CO2, GLUCOSE, BUN, CREATININE, CALCIUM,  in the last 72 hours LFT No results found for this basename: PROT, ALBUMIN, AST, ALT, ALKPHOS, BILITOT, BILIDIR, IBILI,  in the last 72 hours PT/INR No results found for this basename: LABPROT, INR,  in the last 72 hours Hepatitis Panel  Recent Labs  06/20/13 0345  HCVAB NEGATIVE    Studies/Results: Mr Abd W/wo Cm/mrcp  06/20/2013   CLINICAL DATA:  Abnormal ultrasound  EXAM: MRI ABDOMEN WITHOUT AND WITH CONTRAST (INCLUDING MRCP)  TECHNIQUE: Multiplanar multisequence MR imaging of the abdomen was performed both before and after the administration of intravenous contrast. Heavily T2-weighted images of the biliary and pancreatic ducts were obtained, and three-dimensional MRCP images were rendered by post processing.  CONTRAST:  81mL MULTIHANCE GADOBENATE DIMEGLUMINE 529  MG/ML IV SOLN  COMPARISON:  Right upper quadrant dated 06/18/2013  FINDINGS: Motion degraded images.  Liver is within normal limits. No hepatic steatosis. No suspicious/enhancing hepatic lesions.  Spleen, pancreas, and adrenal glands within normal limits.  Gallbladder is mildly contracted. No definite gallstones or associated inflammatory changes.  No intrahepatic ductal dilatation. Common duct measures 7 mm in its midportion but smoothly tapers distally. No choledocholithiasis is seen.  Kidneys are within normal limits.  No hydronephrosis.  No abdominal ascites.  No suspicious abdominal lymphadenopathy.  No focal osseous lesions.  Small right pleural effusion.  IMPRESSION: Motion degraded images.  No intrahepatic ductal dilatation. Common duct measures 7 mm in its midportion but smoothly tapers distally. No choledocholithiasis is seen. If there is continued clinical concern, consider ERCP.  Mildly contracted gallbladder. No definite cholelithiasis or associated inflammatory changes.  Small right pleural effusion.   Electronically Signed   By: Julian Hy M.D.   On: 06/20/2013 20:54    ASSESMENT:   * Graves disease, out of control hyperthyroidism in setting of non med compliance  * Parenchymal liver disease, Dilated CBD without obvious obstructing stones or mass per ultrasound. Elevated alk phos MRCP with 11mm CBD, no choledocholithiasis, slight GB contraction, liver normal.  Hep C Ab negative, mitochondrial  Ab in progress, hep B serologies not ordered.  * Microcytic anemia, longstanding. Ferritin 302.  Low Iron 33, low TIBC, low sats. Folate and B12 normal.  Suspect hyperthyroidism  is source of anemia.  * Cardiomyopathy and tachycardia secondary to hyperthyroidism.     PLAN   *  Will get ANA, Ace level, GGT, LFTs in AM.  AMA is pending. *  Dr Benson Norway will assume care tomorrow.     [name]  06/21/2013, 11:29 AM Pager: [phone]  GI Attending Note  I have personally taken an interval  history, reviewed the chart, and examined the patient.  I agree with the extender's note, impression and recommendations.  Assuming that alk phosphatase is liver in origin, if serologies are not  diagnostic for etiology of cholestasis may consider percutaneous liver biopsy.  Sandy Salaam. Deatra Ina, MD, [facility] [phone]

## 2013-06-21 NOTE — Progress Notes (Signed)
Subjective:  She has no complaints of chest pain today. No pleuritic chest pain and no ischemic type chest pain.  Objective:  Vital Signs in the last 24 hours: BP 100/57  Pulse 85  Temp(Src) 98.9 F (37.2 C) (Oral)  Resp 18  Ht 5\' 3"  (1.6 m)  Wt 54.3 kg (119 lb 11.4 oz)  BMI 21.21 kg/m2  SpO2 96%  Physical Exam: Thin black female currently in no acute distress Lungs:  Clear  Cardiac:  Regular rhythm, normal S1 and S2, no S3 Extremities:  No edema present  Intake/Output from previous day: 06/13 0701 - 06/14 0700 In: 240 [P.O.:240] Out: -  Weight Filed Weights   06/18/13 1119 06/20/13 0500 06/21/13 0505  Weight: 54.205 kg (119 lb 8 oz) 54.432 kg (120 lb) 54.3 kg (119 lb 11.4 oz)    Lab Results:  BNP    Component Value Date/Time   PROBNP 6175.0* 06/18/2013 2335   Telemetry:  Sinus rhythm  Assessment/Plan:  1. Cardiomyopathy-may be due to hyperthyroidism and tachycardia 2. Hyperthyroidism currently getting back on treatment 3. Chest pain was resolved atypical by description  Recommendations:  Currently off of telemetry. Tolerating ACE and beta blocker fine. She will need to have cardiology followup once her hyperthyroidism is treated. Go ahead and make an appointment to followup with [facility] cardiology Dr. Aliene Beams  MD [facility]  06/21/2013, 11:14 AM

## 2013-06-21 NOTE — Progress Notes (Signed)
Seen and examined.  Agree with Dr. Willaim Rayas management and documentation.  Approaching the end of hospitalization.  Hopefully DC tomorrow with appointments for I131 ablation.

## 2013-06-21 NOTE — Progress Notes (Signed)
Family Medicine Teaching Service Daily Progress Note Intern Pager: [phone]  Patient name: Julie Ware Medical record number: [id number] Date of birth: 08-14-1955 Age: 58 y.o. Gender: female  Primary Care Provider: Phill Myron, MD Consultants: Cardiology, GI Code Status: Full code  Pt Overview and Major Events to Date:  6/11: patient admitted 6/12: ECHO:Difuse hypokinesis, systolic (EF 80-99%)IPJ diastolic CHF (without pericardial effusion seen) 6/13: MRCP: no intrahepatic duct dilation, no hepatic steatosis. No gallstones or Cholecystitis.   Assessment and Plan: Julie Ware is a 58 y.o. female presenting with chest pain. PMH is significant for untreated Grave's Disease.   # Chest pain- Patient with chest pain (HEART score 1 for moderately suspicious history, no risk factors).  Imaging reveals cardiomegaly, pericardial effusion and pleural effusion, but ECHO does not show the Pericardial effusion. Free T3 elevated at >20 and free T4 elevated at 6.66. CP, CPK normal. - Negative Troponin X3. Cardiology consulted does not think CP is ischemic in nature and no further work up until Hyperthyroidism is treated. Metoprolol increased and started on Lisinopril  - Continue cardiac monitoring  - EKG prn worsening chest pain  - ASA 325mg    # CHF- BNP elevated with pericardial and pleural effusions not seen on ECHO. - No diuresis at this time as patient appears clinically euvolemic to dry   # Hyperthyroidism, untreated - Imaging earlier this year consistent with Grave's Disease. Not currently on any medication. TSH 0.008 at admission.  - Continue Metoprolol increased yesterday to 100mg  BID. Good tolerance, tachycardia improved. Will continue same dose. - Continue Methimazole 10mg  TID. - regarding preparation for Thyroid ablation pt needs to be off methimazole for 1 week before study.   # Abdominal pain - Noted on exam. Labs unremarkable except for elevated alk phos. Ultrasound  significant for gallbladder thickening, common bile duct dilation and diffuse live parenchymal disease/steatosis. AST/ALT wnl. MRCP was negative for hepatic steatosis, intrahepatic ductal dilation and no signs of infection or stones.   - GI following. - anti-mitochondrial antibody, pending. Hep C negative - PPI - GI cocktail prn pain   # Anemia: stable, anemia panel results are consistent w/ iron deficiency, ferritin elevated may be as acute phase reactant.  - start iron supplementation - FOBT is pending  FEN/GI: Heart healthy, low sodium diet. PPI  Prophylaxis: Heparin SQ  Disposition: pending clinical improvement  Subjective:  Patient feels better and abdominal pain is improved but now reports feeling tired. Denies chest pain today. No N/V.   Objective: Temp:  [98.8 F (37.1 C)-99.4 F (37.4 C)] 98.9 F (37.2 C) (06/14 0505) Pulse Rate:  [62-96] 85 (06/14 0505) Resp:  [18] 18 (06/14 0505) BP: (96-107)/(54-66) 105/63 mmHg (06/14 0505) SpO2:  [96 %-98 %] 96 % (06/14 0505) Weight:  [119 lb 11.4 oz (54.3 kg)] 119 lb 11.4 oz (54.3 kg) (06/14 0505)  Physical Exam: General: lying in bed, NAD Cardiovascular: Regular rhythm, mild tachycardia. No murmurs or gallops heard on auscultation.  Respiratory: Clear to auscultation bilaterally, no wheezing, no crackles. Abdomen: Soft, non distended. Mild epigastric tenderness without guarding or rebound tenderness.  Extremities: No cyanosis, no edema  Laboratory:  Recent Labs Lab 06/17/13 2357  WBC 5.8  HGB 10.7*  HCT 33.7*  PLT 138*    Recent Labs Lab 06/17/13 2357 06/18/13 0518  NA 145  --   K 4.2  --   CL 107  --   CO2 24  --   BUN 16  --   CREATININE 0.33*  --  CALCIUM 9.4  --   PROT  --  6.9  BILITOT  --  0.6  ALKPHOS  --  246*  ALT  --  28  AST  --  35  GLUCOSE 100*  --    TSH  Date Value Ref Range Status  06/18/2013 0.008* 0.350 - 4.500 uIU/mL Final     Performed at [facility]     Free T4  Date  Value Ref Range Status  06/18/2013 6.66* 0.80 - 1.80 ng/dL Final     Performed at [place], Free  Date Value Ref Range Status  06/18/2013 >20.0* 2.3 - 4.2 pg/mL Final     Performed at [facility]    Imaging/Diagnostic Tests: Mr Abd W/wo Cm/mrcp  06/20/2013   CLINICAL DATA:  Abnormal ultrasound  EXAM: MRI ABDOMEN WITHOUT AND WITH CONTRAST (INCLUDING MRCP)  TECHNIQUE: Multiplanar multisequence MR imaging of the abdomen was performed both before and after the administration of intravenous contrast. Heavily T2-weighted images of the biliary and pancreatic ducts were obtained, and three-dimensional MRCP images were rendered by post processing.  CONTRAST:  5mL MULTIHANCE GADOBENATE DIMEGLUMINE 529 MG/ML IV SOLN  COMPARISON:  Right upper quadrant dated 06/18/2013  FINDINGS: Motion degraded images.  Liver is within normal limits. No hepatic steatosis. No suspicious/enhancing hepatic lesions.  Spleen, pancreas, and adrenal glands within normal limits.  Gallbladder is mildly contracted. No definite gallstones or associated inflammatory changes.  No intrahepatic ductal dilatation. Common duct measures 7 mm in its midportion but smoothly tapers distally. No choledocholithiasis is seen.  Kidneys are within normal limits.  No hydronephrosis.  No abdominal ascites.  No suspicious abdominal lymphadenopathy.  No focal osseous lesions.  Small right pleural effusion.  IMPRESSION: Motion degraded images.  No intrahepatic ductal dilatation. Common duct measures 7 mm in its midportion but smoothly tapers distally. No choledocholithiasis is seen. If there is continued clinical concern, consider ERCP.  Mildly contracted gallbladder. No definite cholelithiasis or associated inflammatory changes.  Small right pleural effusion.   Electronically Signed   By: Julian Hy M.D.   On: 06/20/2013 20:54    Dayarmys Piloto de Gwendalyn Ege, MD 06/21/2013, 6:33 AM PGY-3, [place]  Intern pager: [phone], text pages welcome

## 2013-06-22 ENCOUNTER — Inpatient Hospital Stay (HOSPITAL_COMMUNITY): Payer: PRIVATE HEALTH INSURANCE

## 2013-06-22 LAB — ANA: Anti Nuclear Antibody(ANA): NEGATIVE

## 2013-06-22 LAB — TROPONIN I

## 2013-06-22 LAB — CBC
HCT: 34.5 % — ABNORMAL LOW (ref 36.0–46.0)
Hemoglobin: 11 g/dL — ABNORMAL LOW (ref 12.0–15.0)
MCH: 22.7 pg — AB (ref 26.0–34.0)
MCHC: 31.9 g/dL (ref 30.0–36.0)
MCV: 71.3 fL — ABNORMAL LOW (ref 78.0–100.0)
PLATELETS: 137 10*3/uL — AB (ref 150–400)
RBC: 4.84 MIL/uL (ref 3.87–5.11)
RDW: 15.3 % (ref 11.5–15.5)
WBC: 3.2 10*3/uL — ABNORMAL LOW (ref 4.0–10.5)

## 2013-06-22 LAB — LIPASE, BLOOD: Lipase: 22 U/L (ref 11–59)

## 2013-06-22 LAB — HEPATIC FUNCTION PANEL
ALK PHOS: 243 U/L — AB (ref 39–117)
ALT: 24 U/L (ref 0–35)
AST: 24 U/L (ref 0–37)
Albumin: 3.1 g/dL — ABNORMAL LOW (ref 3.5–5.2)
BILIRUBIN TOTAL: 0.5 mg/dL (ref 0.3–1.2)
Bilirubin, Direct: <0.2 mg/dL (ref 0.0–0.3)
Total Protein: 7.6 g/dL (ref 6.0–8.3)

## 2013-06-22 LAB — MITOCHONDRIAL ANTIBODIES: Mitochondrial M2 Ab, IgG: 0.25 (ref <0.91)

## 2013-06-22 LAB — ANGIOTENSIN CONVERTING ENZYME: Angiotensin-Converting Enzyme: 16 U/L (ref 8–52)

## 2013-06-22 MED ORDER — ASPIRIN 325 MG PO TBEC: 325.0000 mg | DELAYED_RELEASE_TABLET | Freq: Every day | ORAL | Status: DC

## 2013-06-22 MED ORDER — MORPHINE SULFATE 2 MG/ML IJ SOLN
2.0000 mg | Freq: Once | INTRAMUSCULAR | Status: AC
  Administered 2013-06-22: 2 mg via INTRAVENOUS
  Filled 2013-06-22: qty 1

## 2013-06-22 MED ORDER — IBUPROFEN 600 MG PO TABS
600.0000 mg | ORAL_TABLET | Freq: Once | ORAL | Status: DC
  Filled 2013-06-22: qty 1

## 2013-06-22 MED ORDER — FERROUS SULFATE 325 (65 FE) MG PO TABS: 325.0000 mg | ORAL_TABLET | Freq: Two times a day (BID) | ORAL | Status: DC

## 2013-06-22 MED ORDER — LISINOPRIL 2.5 MG PO TABS: 2.5000 mg | ORAL_TABLET | Freq: Every day | ORAL | Status: DC

## 2013-06-22 MED ORDER — METOPROLOL TARTRATE 50 MG PO TABS: 100.0000 mg | ORAL_TABLET | Freq: Two times a day (BID) | ORAL | Status: DC

## 2013-06-22 MED ORDER — METOPROLOL TARTRATE 50 MG PO TABS
50.0000 mg | ORAL_TABLET | Freq: Two times a day (BID) | ORAL | Status: DC
  Administered 2013-06-22 – 2013-06-24 (×4): 50 mg via ORAL
  Filled 2013-06-22 (×5): qty 1

## 2013-06-22 MED ORDER — METHIMAZOLE 10 MG PO TABS: 10.0000 mg | ORAL_TABLET | Freq: Three times a day (TID) | ORAL | Status: DC

## 2013-06-22 NOTE — Progress Notes (Signed)
Interim Progress Note:   Called to bedside for chest pain. Patient about to get discharged when she started having sudden onset of chest pain. Pain is sharp, starts in the epigastric region and radiates upwards. Worst with breathing and laying down. GI cocktail did not help pain. No nausea, no vomiting. No shortness of breath or difficulty breathing. No worsening symptoms with ambulation O:  Filed Vitals:   06/22/13 0518 06/22/13 0519 06/22/13 1405 06/22/13 2039  BP: 89/49 93/41 84/38  99/54  Pulse: 81  79 92  Temp: 98.4 F (36.9 C)  97.8 F (36.6 C) 98.2 F (36.8 C)  TempSrc: Oral  Oral Oral  Resp: 18  16 18   Height:      Weight: 110 lb 1.6 oz (49.941 kg)     SpO2: 100%  100% 100%   Gen: patient in mild distress from pain, non toxic appearing CV: S1S2, RRR, no murmur, no rub appreciated Pulm: CTA b/l, no crackles or wheezing, normal work of breathing Abdomen: tenderness to palpation along epigastrium as well as mid chest wall. No rebound, no guarding.   A/P:  58 yo female with uncontroled Graves and hospitalization for evaluation of chest pain with recurrent chest pain, similar to previous events. Differential include cardiac etiology vs pancreatitis vs pericarditis vs gastritis. Pericarditis could be possibility given presentation.  - will cycle troponins. EKG showing some artifact in V4, V5 but no ST elevation seen.  - check lipase - treat pain with morphine - consider trial of NSAIDs to see how she responds.  - received ASA today - continue metoprolol - tele monitoring.   Marena Chancy, PGY-3 Family Medicine Resident

## 2013-06-22 NOTE — Discharge Summary (Signed)
[facility] Discharge Summary  Patient name: Julie Ware Medical record number: [id number] Date of birth: [birth year] Age: 58 y.o. Gender: female Date of Admission: 06/17/2013  Date of Discharge: 06/24/2013 Admitting Physician: Zigmund Gottron, MD  Primary Care Provider: Phill Myron, MD Consultants: Cardiology, Gastroenterology  Indication for Hospitalization: Chest pain  Discharge Diagnoses/Problem List:  Chest pain Hyperthyroidism HFrEF Abdominal pain Anemia  Disposition: Discharge home  Discharge Condition: Stable  Discharge Exam:  General: lying in bed, NAD  Cardiovascular: Regular rhythm, regular rate, 2/6 systolic murmur that radiates to carotids , equal radial pulses bilaterally. No reproducible chest pain. Respiratory: Clear to auscultation bilaterally, no wheezing, no crackles.  Abdomen: Soft, non distended. Non-tender  Extremities: No cyanosis, no edema  Brief Hospital Course:   HPI: Julie Ware is a 58 y.o. female presenting with chest pain. Patient states her chest pain started acutely yesterday around 4:00pm. Pain was constant, midchest, radiates to pain at times. Some radiation to neck and arm. States her pain was worse with exertion. Reports shortness of breath and fatigue assoicated with chest pain. No N/V or diaphoresis. Had similar episode last year. No other heart problems. Mother died of MI at age 58. Non-smoker. She does not have diabetes, HTN or HLD. She does have thyroid disease but has not taken her thyroid medication in over one month. No leg edema, but reports eye swelling, palpitations, dry skin.  Patient diagnosed with Grave's disease earlier this year with hyperthyroidism. She was put on methimazole, but she has not taken in a while.   Chest pain: Heart score of 1. EKG with no significant changes. Troponin cycled x3 on two occasions and negative. Cardiology saw patient and felt pain is not ischemic. Concern  for possible referred pain from possible gallbladder pathology vs costochondritis vs pericarditis. Pain was not positional. GI cocktail not helpful. Morphine helped with pain. Pain was not initially reproducible but eventually subsided after ibuprofen regimen.  Hyperthyroidism: TSH low, T3 and t4 elevated. Patient restarted on methimazole and metoprolol titrated to a tolerable dose of 50mg  BID.  HFrEF: Chest x-ray showed cardiomegaly, pericardial effusion and pleural effusion. Patient found to have new onset heart failure. BNP of 6175. EF of 35-40%, increased right ventricular systolic pressures, no pericardial and moderate pulmonary hypertension. Thought to be due to tachycardia vs ischemia.  No diuresis needed as patient was euvolemic. Lisinopril 2.5mg  started.  Abdominal pain: abdominal pain controlled with morphine. CT abdomen showed non-specific gallblader and liver findings. GI consulted and started workup for gallbladder pathology. AMA negative, GGT elevated, CRP elevated. Alk phos persistently elevated to 200 range with normal LFTs. MRCP did not show pathology.  Anemia: iron panel obtained. Consistent with anemia of chronic disease picture. Unsure of chronic disease but iron stores low. Iron regimen started.  Issues for Follow Up:  1. Recommend thyroid ablation for patient since she has been non-compliant with medication. Patient needs uptake and scan before ablation. Patient can only get this after 6 weeks of no iodine contrast exposure. Last exposure 6/13. Must also be off methimazole for a week before study. Recommend setting uptake and scan around last week of July to first week of August. Call [phone] to schedule uptake and scan and arrange ablation with nuclear medicine.  2. Follow-up of heart failure. Patient to follow-up with cardiology 3. Consider Bmet in 3 months to evaluate kidney function with start of low dose ACE  Significant Procedures:  1. MRCP 2. 2D  Echocardiogram  Significant Labs and  Imaging:   Recent Labs Lab 06/22/13 1020 06/23/13 0533  WBC 3.2* 3.3*  HGB 11.0* 11.3*  HCT 34.5* 34.7*  PLT 137* 127*    Recent Labs Lab 06/22/13 0500 06/23/13 1026  NA  --  144  K  --  3.4*  CL  --  107  CO2  --  24  GLUCOSE  --  118*  BUN  --  17  CREATININE  --  0.27*  CALCIUM  --  9.6  ALKPHOS 243*  --   AST 24  --   ALT 24  --   ALBUMIN 3.1*  --        Component Value Date/Time   TSH 0.008* 06/18/2013 0225      Component Value Date/Time   T3FREE >20.0* 06/18/2013 1325   FREET4 6.66* 06/18/2013 1325    Lipid Panel     Component Value Date/Time   CHOL 112 06/20/2013 0345   TRIG 50 06/20/2013 0345   HDL 41 06/20/2013 0345   CHOLHDL 2.7 06/20/2013 0345   VLDL 10 06/20/2013 0345   LDLCALC 61 06/20/2013 0345   Lab Results  Component Value Date   IRON 33* 06/20/2013   TIBC 198* 06/20/2013   IRONPCTSAT 17* 06/20/2013   UIBC 165 06/20/2013   FERRITIN 302* 06/20/2013   Cardiac Panel (last 3 results)  Recent Labs  06/22/13 2200 06/23/13 0533 06/23/13 0910  TROPONINI <0.30 <0.30 <0.30    MRCP (6/13) IMPRESSION:  Motion degraded images.  No intrahepatic ductal dilatation. Common duct measures 7 mm in its  midportion but smoothly tapers distally. No choledocholithiasis is  seen. If there is continued clinical concern, consider ERCP.  Mildly contracted gallbladder. No definite cholelithiasis or  associated inflammatory changes.  Small right pleural effusion.  Echocardiogram (6/11) Study Conclusions  - Left ventricle: The cavity size was normal. Systolic function was moderately reduced. The estimated ejection fraction was in the range of 35% to 40%. Diffuse hypokinesis. - Aortic valve: There was moderate regurgitation. - Mitral valve: There was moderate regurgitation. - Left atrium: The atrium was mildly dilated. - Right ventricle: The cavity size was mildly to moderately dilated. Wall thickness was normal.  Systolic function was mildly reduced. - Right atrium: The atrium was mildly to moderately dilated. - Tricuspid valve: There was moderate regurgitation. - Pulmonic valve: There was moderate regurgitation. - Pulmonary arteries: PA peak pressure: 59 mm Hg (S).  Impressions:  - The right ventricular systolic pressure was increased consistent with moderate pulmonary hypertension.   Dg Chest 2 View  06/18/2013   CLINICAL DATA:  Chest pain and shortness of breath  EXAM: CHEST  2 VIEW  COMPARISON:  06/29/2012  FINDINGS: Cardiac enlargement. Pulmonary vascularity is normal. Interstitial changes probably fibrosis but early interstitial edema not excluded. No focal consolidation or airspace disease. No blunting of costophrenic angles.  IMPRESSION: Cardiac enlargement. Nonspecific mild interstitial changes. No focal consolidation.   Electronically Signed   By: Lucienne Capers M.D.   On: 06/18/2013 02:38   Ct Angio Chest Aorta W/cm &/or Wo/cm  06/18/2013   CLINICAL DATA:  Central chest pain radiating to back, assess for dissection.  EXAM: CT ANGIOGRAPHY CHEST WITH CONTRAST  TECHNIQUE: Multidetector CT imaging of the chest was performed using the standard protocol during bolus administration of intravenous contrast. Multiplanar CT image reconstructions and MIPs were obtained to evaluate the vascular anatomy.  CONTRAST:  140mL OMNIPAQUE IOHEXOL 350 MG/ML SOLN  COMPARISON:  Chest radiograph June 18, 2013  FINDINGS: Examination  is timed for assessment of the pulmonary arteries. Main pulmonary artery is upper limits of normal in size. No pulmonary arterial filling defects to the subsegmental branches.  Thoracic aorta is normal in course and caliber without dissection, aneurysm, suspicious luminal irregularity, contrast extravasation or periaortic fluid collections. Normal 3 vessel aortic arch. The heart is moderate to severely enlarged, trace pericardial effusion. Prominent but not pathologically enlarged axillary  lymph nodes may be reactive. 7 mm short axis aortopulmonary window lymph node.  Patchy ground-glass opacities within the upper lobes bilaterally, to lesser extent lingula and lower lobes with enhancing right lower lobe patchy airspace opacity and small right pleural effusion.  Included view of the abdomen is unremarkable. Mild osteopenia. Sub cm sclerotic lesion in a single mid thoracic level may reflect bone island. Thyromegaly, the isthmus is 14 mm.  Review of the MIP images confirms the above findings.  IMPRESSION: No acute pulmonary embolism. No aortic dissection or acute vascular process.  Moderate to severe cardiomegaly and trace pericardial effusion. Small right pleural effusion with patchy ground-glass opacities which could reflect infectious or inflammatory process. Enhancing atelectasis right lung base.  Thyromegaly, consider correlation with thyroid function tests if clinically indicated.   Electronically Signed   By: Awilda Metro   On: 06/18/2013 03:45   US Abdomen Limited Ruq  06/18/2013   CLINICAL DATA:  Chest pain  EXAM: US ABDOMEN LIMITED - RIGHT UPPER QUADRANT  COMPARISON:  None.  FINDINGS: Gallbladder:  No gallstones. The gallbladder wall is thickened. Negative Murphy sign. The wall measures up to 8 mm in thickness. Edema within the wall is present.  Common bile duct:  Diameter: 8 mm in caliber.  Liver:  Increased echogenicity throughout the liver. 9 mm simple cyst in the right lobe.  Additional findings: Right pleural effusion.  IMPRESSION: There is nonspecific gallbladder wall thickening. No definite gallstones.  Increased echogenicity with the liver suggesting diffuse hepatic parenchymal disease or diffuse hepatic steatosis.  Common bile duct is dilated. Biliary obstruction is not excluded. Correlate with liver function tests.   Electronically Signed   By: Maryclare Bean M.D.   On: 06/18/2013 21:37   Results/Tests Pending at Time of Discharge: None  Discharge Medications:    Medication  List    STOP taking these medications       ATENOLOL PO      TAKE these medications       aspirin 325 MG EC tablet  Take 1 tablet (325 mg total) by mouth daily.     ferrous sulfate 325 (65 FE) MG tablet  Take 1 tablet (325 mg total) by mouth 2 (two) times daily with a meal.     ibuprofen 600 MG tablet  Commonly known as:  ADVIL,MOTRIN  Take 1 tablet (600 mg total) by mouth every 6 (six) hours as needed for mild pain or moderate pain.     lisinopril 2.5 MG tablet  Commonly known as:  PRINIVIL,ZESTRIL  Take 1 tablet (2.5 mg total) by mouth daily.     methimazole 10 MG tablet  Commonly known as:  TAPAZOLE  Take 1 tablet (10 mg total) by mouth 3 (three) times daily.     metoprolol 50 MG tablet  Commonly known as:  LOPRESSOR  Take 2 tablets (100 mg total) by mouth 2 (two) times daily.        Discharge Instructions: Please refer to Patient Instructions section of EMR for full details.  Patient was counseled important signs and symptoms that should prompt  return to medical care, changes in medications, dietary instructions, activity restrictions, and follow up appointments.   Follow-Up Appointments: Follow-up Information   Follow up with Melrose Nakayama, MD On 06/29/2013. (1:30PM, For hospital follow-up)    Specialty:  Family Medicine   Contact information:   [place] [address] [phone]       Follow up with Sueanne Margarita, MD. Call in 8 weeks. (Please call to confirm an appointment with Cardiology in 8 weeks.)    Specialty:  Cardiology   Contact information:   [address] [place] [address] [phone]       Cordelia Poche, MD 06/25/2013, 5:50 PM PGY-1, [place]

## 2013-06-22 NOTE — Progress Notes (Signed)
FMTS Attending Daily Note:  Renold Don MD  [phone] pager  Family Practice pager:  [phone] I have seen and examined this patient and have reviewed their chart. I have discussed this patient with the resident. I agree with the resident's findings, assessment and care plan.  Additionally:  BP's borderline low. Plan to decrease Lopressor prior to DC. FU with PCP and cardiology on outpt basis Needs to hold Methimazole for 1 week prior to ablation therapy.  Ok to DC home today.   Tobey Grim, MD 06/22/2013 4:13 PM

## 2013-06-22 NOTE — Progress Notes (Signed)
Subjective: Reports sharp CP earlier this morning while in bed which resolved on its own.    Objective: Vital signs in last 24 hours: Temp:  [98.2 F (36.8 C)-98.4 F (36.9 C)] 98.4 F (36.9 C) (06/15 0518) Pulse Rate:  [79-88] 81 (06/15 0518) Resp:  [18] 18 (06/15 0518) BP: (89-100)/(41-57) 93/41 mmHg (06/15 0519) SpO2:  [96 %-100 %] 100 % (06/15 0518) Weight:  [110 lb 1.6 oz (49.941 kg)] 110 lb 1.6 oz (49.941 kg) (06/15 0518) Last BM Date: 06/20/13  Intake/Output from previous day: 06/14 0701 - 06/15 0700 In: 200 [P.O.:200] Out: -  Intake/Output this shift:    Medications Current Facility-Administered Medications  Medication Dose Route Frequency Provider Last Rate Last Dose  . 0.9 %  sodium chloride infusion   Intravenous Continuous Amber Nydia Bouton, MD      . aspirin EC tablet 325 mg  325 mg Oral Daily Hilarie Fredrickson, MD   325 mg at 06/21/13 0957  . ferrous sulfate tablet 325 mg  325 mg Oral TID WC Dayarmys Piloto de Criselda Peaches, MD   325 mg at 06/21/13 1745  . gi cocktail (Maalox,Lidocaine,Donnatal)  30 mL Oral QID PRN Hilarie Fredrickson, MD      . heparin injection 5,000 Units  5,000 Units Subcutaneous 3 times per day Hilarie Fredrickson, MD   5,000 Units at 06/22/13 0525  . lisinopril (PRINIVIL,ZESTRIL) tablet 2.5 mg  2.5 mg Oral Daily Hazeline Junker, MD   2.5 mg at 06/21/13 0957  . methimazole (TAPAZOLE) tablet 10 mg  10 mg Oral TID Renee A Kuneff, DO   10 mg at 06/21/13 2140  . metoprolol (LOPRESSOR) tablet 100 mg  100 mg Oral BID Sanjuana Letters, MD   100 mg at 06/21/13 2140  . morphine 2 MG/ML injection 2 mg  2 mg Intravenous Q2H PRN Hilarie Fredrickson, MD   2 mg at 06/21/13 1111    PE: General appearance: alert, cooperative and no distress Lungs: clear to auscultation bilaterally Heart: regular rate and rhythm and 1/6 sys MM Abdomen: +BS,  tender in the RUQ Extremities: No LEE Pulses: 2+ and symmetric Skin: Warm and dry Neurologic: Grossly normal    Recent  Labs  06/20/13 0345  CHOL 112   Lipid Panel     Component Value Date/Time   CHOL 112 06/20/2013 0345   TRIG 50 06/20/2013 0345   HDL 41 06/20/2013 0345   CHOLHDL 2.7 06/20/2013 0345   VLDL 10 06/20/2013 0345   LDLCALC 61 06/20/2013 0345     Cardiac Enzymes No components found with this basename: TROPONIN,  CKMB,   Studies/Results: @RISRSLT2 @   Assessment/Plan  58 y.o. female presenting with chest pain. Patient states her chest pain started acutely yesterday around 4:00pm. Pain was constant, midchest, radiating at times to the neck, back and arm. States her pain was worse with exertion. Reports shortness of breath and fatigue assoicated with chest pain. No N/V or diaphoresis. Had similar episode last year. No other heart problems. Mother died of MI at age 85. Non-smoker. She does not have diabetes, HTN or HLD. She does have thyroid disease but has not taken her thyroid medication in over one month. No leg edema, but reports eye swelling, palpitations, dry skin. Patient diagnosed with Grave's disease earlier this year with hyperthyroidism. She was put on methimazole, but she has not taken in a while. Cardiac enzymes have been normal x 2. Chest CT showed no PE or aortic dissection.  There was moderate to severe cardiomegaly and trace pericardial effusion. Small right pleural effusion with patchy ground-glass opacities which could reflect infectious or inflammatory process. Enhancing atelectasis right lung base.  Active Problems:   Cardiomyopathy, dilated, nonischemic   EF 35-40% Moderate AI, TR, PR and MR, normal LV.  Peak PA pressure 59mmHg.  Will need to weigh daily. Low sodium diet.     Chest pain Had some sharp CP this morning.  Resolved.  Maintaining NSR on Tele with no alarms in last 24 hours.  Per Dr. Donnie Ahoilley, defer Ischemic work up until hyperthyroidism is under better control with Dr. Mayford Knifeurner.  Ruled out for MI.  ASA , lopressor.  Lipids look excellent.  Not on medication.     Hypotension 93/41.  On lopressor 100mg  BID, lisinopril 2.5mg .  Recommend discontinuing ACE as her BP has been consistently low for the last 48 hours.     Hyperthyroidism:  Free T3 >20.  Methemizole   Nonspecific (abnormal) findings on radiological and other examination of biliary tract   Nonspecific abnormal results of liver function study      Abdominal pain:  GI following   LOS: 5 days    HAGER, BRYAN PA-C 06/22/2013 7:52 AM  Patient examined chart reviewed  In NSR on lopressor   Cardiac status stable Will sign off  Outpatient f/u with Dr Mayford Knifeurner about 8 weeks for further cardiac  W/u once less hyperthyroid.  [facility]  Consider f/u With endocrine as well  Charlton HawsPeter Philis Doke

## 2013-06-22 NOTE — Progress Notes (Signed)
Family Medicine Teaching Service Daily Progress Note Intern Pager: [phone]  Patient name: Julie Ware Medical record number: [id number] Date of birth: Sep 11, 1955 Age: 58 y.o. Gender: female  Primary Care Provider: Phill Myron, MD Consultants: Cardiology, GI Code Status: Full code  Pt Overview and Major Events to Date:  6/11: patient admitted 6/12: ECHO:Difuse hypokinesis, systolic (EF 14-78%)GNF diastolic CHF (without pericardial effusion seen) 6/13: MRCP: no intrahepatic duct dilation, no hepatic steatosis. No gallstones or Cholecystitis.   Assessment and Plan: Julie Ware is a 58 y.o. female presenting with chest pain. PMH is significant for untreated Grave's Disease.   # Chest pain- Patient with chest pain (HEART score 1 for moderately suspicious history, no risk factors).  Imaging reveals cardiomegaly, pericardial effusion and pleural effusion, but ECHO does not show the Pericardial effusion. Free T3 elevated at >20 and free T4 elevated at 6.66. CP, CPK normal. - Negative Troponin X3. Cardiology consulted does not think CP is ischemic in nature and no further work up until Hyperthyroidism is treated. Metoprolol increased and started on Lisinopril  - Continue cardiac monitoring  - EKG prn worsening chest pain  - ASA $Rem'325mg'KlcK$    # CHF- BNP elevated at 6175 with pericardial and pleural effusions not seen on ECHO. EF of 35-40%. 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 Not currently on any medication. TSH 0.008 at admission.  - Metoprolol increased to $RemoveBefo'100mg'fLBrGPSLtzu$  BID. Patient's tachycardia improved but symptomatic with low blood pressure. Will likely decrease dose to 50 BID. - Plan to d/c methimazole so patient can be prepare for possible ablation. Needs to be off for one week prior to thyroid uptake scan  #  Abdominal pain - Noted on exam. Labs unremarkable except for elevated alk phos. Ultrasound significant for gallbladder thickening, common bile duct dilation and diffuse live parenchymal disease/steatosis. AST/ALT wnl. MRCP was negative for hepatic steatosis, intrahepatic ductal dilation and no signs of infection or stones.   - GI following. - anti-mitochondrial antibody, pending. Hep C negative - PPI - GI cocktail prn pain   # Anemia: stable, anemia panel results are consistent w/ anemia of chronic disease.  - FOBT is pending  FEN/GI: Heart healthy, low sodium diet. PPI  Prophylaxis: Heparin SQ  Disposition: pending clinical improvement  Subjective:  Patient feels better and abdominal pain is improved but now reports feeling tired. Denies chest pain today. No N/V.   Objective: Temp:  [98.2 F (36.8 C)-98.4 F (36.9 C)] 98.4 F (36.9 C) (06/15 0518) Pulse Rate:  [79-88] 81 (06/15 0518) Resp:  [18] 18 (06/15 0518) BP: (89-100)/(41-57) 93/41 mmHg (06/15 0519) SpO2:  [96 %-100 %] 100 % (06/15 0518) Weight:  [110 lb 1.6 oz (49.941 kg)] 110 lb 1.6 oz (49.941 kg) (06/15 0518)  Physical Exam: General: lying in bed, NAD Cardiovascular: Regular rhythm, regular rate, 2/6 systolic murmur that radiates to carotids  Respiratory: Clear to auscultation bilaterally, no wheezing, no crackles. Abdomen: Soft, non distended. Non-tender Extremities: No cyanosis, no edema  Laboratory:  Recent Labs Lab 06/17/13 2357  WBC 5.8  HGB 10.7*  HCT 33.7*  PLT 138*    Recent Labs Lab 06/17/13 2357 06/18/13 0518 06/22/13 0500  NA 145  --   --   K 4.2  --   --   CL 107  --   --   CO2 24  --   --  BUN 16  --   --   CREATININE 0.33*  --   --   CALCIUM 9.4  --   --   PROT  --  6.9 7.6  BILITOT  --  0.6 0.5  ALKPHOS  --  246* 243*  ALT  --  28 24  AST  --  35 24  GLUCOSE 100*  --   --    TSH  Date Value Ref Range Status  06/18/2013 0.008* 0.350 - 4.500 uIU/mL Final     Performed at [facility]     Free T4  Date Value Ref Range Status  06/18/2013 6.66* 0.80 - 1.80 ng/dL Final     Performed at [place], Free  Date Value Ref Range Status  06/18/2013 >20.0* 2.3 - 4.2 pg/mL Final     Performed at [facility]    Imaging/Diagnostic Tests: Mr Abd W/wo Cm/mrcp  06/20/2013   CLINICAL DATA:  Abnormal ultrasound  EXAM: MRI ABDOMEN WITHOUT AND WITH CONTRAST (INCLUDING MRCP)  TECHNIQUE: Multiplanar multisequence MR imaging of the abdomen was performed both before and after the administration of intravenous contrast. Heavily T2-weighted images of the biliary and pancreatic ducts were obtained, and three-dimensional MRCP images were rendered by post processing.  CONTRAST:  71mL MULTIHANCE GADOBENATE DIMEGLUMINE 529 MG/ML IV SOLN  COMPARISON:  Right upper quadrant dated 06/18/2013  FINDINGS: Motion degraded images.  Liver is within normal limits. No hepatic steatosis. No suspicious/enhancing hepatic lesions.  Spleen, pancreas, and adrenal glands within normal limits.  Gallbladder is mildly contracted. No definite gallstones or associated inflammatory changes.  No intrahepatic ductal dilatation. Common duct measures 7 mm in its midportion but smoothly tapers distally. No choledocholithiasis is seen.  Kidneys are within normal limits.  No hydronephrosis.  No abdominal ascites.  No suspicious abdominal lymphadenopathy.  No focal osseous lesions.  Small right pleural effusion.  IMPRESSION: Motion degraded images.  No intrahepatic ductal dilatation. Common duct measures 7 mm in its midportion but smoothly tapers distally. No choledocholithiasis is seen. If there is continued clinical concern, consider ERCP.  Mildly contracted gallbladder. No definite cholelithiasis or associated inflammatory changes.  Small right pleural effusion.   Electronically Signed   By: Julian Hy M.D.   On: 06/20/2013 20:54    Cordelia Poche, MD 06/22/2013, 6:55 AM PGY-1, [place] Intern pager: [phone], text pages welcome

## 2013-06-23 LAB — BASIC METABOLIC PANEL
BUN: 17 mg/dL (ref 6–23)
CALCIUM: 9.6 mg/dL (ref 8.4–10.5)
CO2: 24 mEq/L (ref 19–32)
Chloride: 107 mEq/L (ref 96–112)
Creatinine, Ser: 0.27 mg/dL — ABNORMAL LOW (ref 0.50–1.10)
GFR calc Af Amer: >90 mL/min (ref >90)
GLUCOSE: 118 mg/dL — AB (ref 70–99)
Potassium: 3.4 mEq/L — ABNORMAL LOW (ref 3.7–5.3)
Sodium: 144 mEq/L (ref 137–147)

## 2013-06-23 LAB — CBC
HCT: 34.7 % — ABNORMAL LOW (ref 36.0–46.0)
Hemoglobin: 11.3 g/dL — ABNORMAL LOW (ref 12.0–15.0)
MCH: 23.1 pg — AB (ref 26.0–34.0)
MCHC: 32.6 g/dL (ref 30.0–36.0)
MCV: 70.8 fL — AB (ref 78.0–100.0)
PLATELETS: 127 10*3/uL — AB (ref 150–400)
RBC: 4.9 MIL/uL (ref 3.87–5.11)
RDW: 15.2 % (ref 11.5–15.5)
WBC: 3.3 10*3/uL — ABNORMAL LOW (ref 4.0–10.5)

## 2013-06-23 LAB — TROPONIN I

## 2013-06-23 LAB — SEDIMENTATION RATE: SED RATE: 13 mm/h (ref 0–22)

## 2013-06-23 LAB — C-REACTIVE PROTEIN: CRP: <0.5 mg/dL — ABNORMAL LOW (ref <0.60)

## 2013-06-23 MED ORDER — SENNOSIDES-DOCUSATE SODIUM 8.6-50 MG PO TABS
1.0000 | ORAL_TABLET | Freq: Every day | ORAL | Status: DC
  Administered 2013-06-23: 1 via ORAL
  Filled 2013-06-23 (×2): qty 1

## 2013-06-23 MED ORDER — IBUPROFEN 600 MG PO TABS
600.0000 mg | ORAL_TABLET | Freq: Four times a day (QID) | ORAL | Status: DC | PRN
  Administered 2013-06-23 – 2013-06-24 (×3): 600 mg via ORAL
  Filled 2013-06-23 (×3): qty 1

## 2013-06-23 MED ORDER — OXYCODONE HCL 5 MG PO TABS: 5.0000 mg | ORAL_TABLET | Freq: Four times a day (QID) | ORAL | Status: DC | PRN

## 2013-06-23 NOTE — Progress Notes (Signed)
FMTS Attending Daily Note:  Annabell Sabal MD  [phone] pager  Family Practice pager:  [phone] I have seen and examined this patient and have reviewed their chart. I have discussed this patient with the resident. I agree with the resident's findings, assessment and care plan.  Additionally:  Still with pain today.  Not ACS.  Not likely pericarditis.  With elevated alk phos/GGT, though pain not related to food intake.  Await GI rec's for today.   Alveda Reasons, MD 06/23/2013 1:40 PM

## 2013-06-23 NOTE — Progress Notes (Signed)
Subjective: Feeling better with the pain medications.  Objective: Vital signs in last 24 hours: Temp:  [97.9 F (36.6 C)-98.3 F (36.8 C)] 98.3 F (36.8 C) (06/16 1408) Pulse Rate:  [79-92] 79 (06/16 1408) Resp:  [17-18] 17 (06/16 1408) BP: (99-108)/(49-59) 99/49 mmHg (06/16 1408) SpO2:  [94 %-100 %] 94 % (06/16 1408) Weight:  [110 lb 7.2 oz (50.1 kg)] 110 lb 7.2 oz (50.1 kg) (06/16 0535) Last BM Date: 06/20/13  Intake/Output from previous day:   Intake/Output this shift: Total I/O In: 480 [P.O.:480] Out: -   General appearance: alert and no distress GI: soft, non-tender; bowel sounds normal; no masses,  no organomegaly Musculoskeletal: reproducible pain with palpation of the sternocostal joints  Lab Results:  Recent Labs  06/22/13 1020 06/23/13 0533  WBC 3.2* 3.3*  HGB 11.0* 11.3*  HCT 34.5* 34.7*  PLT 137* 127*   BMET  Recent Labs  06/23/13 1026  NA 144  K 3.4*  CL 107  CO2 24  GLUCOSE 118*  BUN 17  CREATININE 0.27*  CALCIUM 9.6   LFT  Recent Labs  06/22/13 0500  PROT 7.6  ALBUMIN 3.1*  AST 24  ALT 24  ALKPHOS 243*  BILITOT 0.5  BILIDIR <0.2  IBILI NOT CALCULATED   PT/INR No results found for this basename: LABPROT, INR,  in the last 72 hours Hepatitis Panel No results found for this basename: HEPBSAG, HCVAB, HEPAIGM, HEPBIGM,  in the last 72 hours C-Diff No results found for this basename: CDIFFTOX,  in the last 72 hours Fecal Lactopherrin No results found for this basename: FECLLACTOFRN,  in the last 72 hours  Studies/Results: Dg Chest Port 1 View  06/23/2013   CLINICAL DATA:  Chest pain.  EXAM: PORTABLE CHEST - 1 VIEW  COMPARISON:  Chest radiograph and CTA of the chest performed 06/18/2013  FINDINGS: The lungs are well-aerated and clear. There is no evidence of focal opacification, pleural effusion or pneumothorax.  The cardiomediastinal silhouette is enlarged. No acute osseous abnormalities are seen.  IMPRESSION: No acute  cardiopulmonary process seen.  Cardiomegaly noted.   Electronically Signed   By: Roanna RaiderJeffery  Chang M.D.   On: 06/23/2013 02:55    Medications:  Scheduled: . aspirin EC  325 mg Oral Daily  . ferrous sulfate  325 mg Oral TID WC  . heparin  5,000 Units Subcutaneous 3 times per day  . ibuprofen  600 mg Oral Once  . lisinopril  2.5 mg Oral Daily  . methimazole  10 mg Oral TID  . metoprolol tartrate  50 mg Oral BID  . senna-docusate  1 tablet Oral QHS   Continuous: . sodium chloride Stopped (06/18/13 2022)    Assessment/Plan: 1) Costochondritis. 2) Right heart failure with resultant elevation in AP. 3) Grave's disease.   I was able to reproduce the patient's pain with palpation of her sternum.  It is consistent with costochondritis.  I recommend the use of steroids, but with her Graves disease and CHF NSAIDs may be a better choice.  I will defer further management to the primary team.  As for her elevated AP, it is clear that this is from her right heart failure.  Her echo reveals pulmonary HTN and dilated atria as well as ventricles.  Her BNP is also elevated.  Her EF is at 40%, but she has clear hepatojugular reflux.    Plan: 1) Focus treatment on her Grave's disease and costochondritis. 2) Signing off.  LOS: 6 days   HUNG,PATRICK D  06/23/2013, 4:14 PM

## 2013-06-23 NOTE — Progress Notes (Signed)
Family Medicine Teaching Service Daily Progress Note Intern Pager: [phone]  Patient name: Julie Ware Medical record number: [id number] Date of birth: [birth year] Age: 58 y.o. Gender: female  Primary Care Provider: Phill Myron, MD Consultants: Cardiology, GI Code Status: Full code  Pt Overview and Major Events to Date:  6/11: patient admitted 6/12: ECHO:Difuse hypokinesis, systolic (EF 78-93%)YBO diastolic CHF (without pericardial effusion seen) 6/13: MRCP: no intrahepatic duct dilation, no hepatic steatosis. No gallstones or Cholecystitis.   Assessment and Plan: Julie Ware is a 58 y.o. female presenting with chest pain. PMH is significant for untreated Grave's Disease.   # Chest pain- Patient with chest pain (HEART score 1 for moderately suspicious history, no risk factors).  Imaging reveals cardiomegaly, pericardial effusion and pleural effusion, but ECHO does not show the Pericardial effusion. Free T3 elevated at >20 and free T4 elevated at 6.66. CP, CPK normal. Could be referred pain from possible gallbladder pathology. - Negative Troponin X3. Cardiology consulted does not think CP is ischemic in nature and no further work up until Hyperthyroidism is treated.  - continue Metoprolol and on Lisinopril  - Continue cardiac monitoring  - EKG prn worsening chest pain  - ASA 361m   # CHF- BNP elevated at 6175 with pericardial and pleural effusions not seen on ECHO. EF of 35-40%. 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 Not currently on any medication. TSH 0.008 at admission.  - Metoprolol decreased to 551mBID. Patient tolerating well - Continue methimazole since patient has to wait 6 weeks before she can get an uptake and scan study for possible ablation.  # Abdominal pain - Noted on exam. Labs  unremarkable except for elevated alk phos. Ultrasound significant for gallbladder thickening, common bile duct dilation and diffuse live parenchymal disease/steatosis. AST/ALT wnl. MRCP was negative for hepatic steatosis, intrahepatic ductal dilation and no signs of infection or stones. AMA negative. Hep C negative. GGT elevated. MRCP not showing evidence for current symptoms. - GI following. - PPI - Morphine PRN for pain   # Anemia: stable, anemia panel results are consistent w/ anemia of chronic disease.  - FOBT is pending  FEN/GI: Heart healthy, low sodium diet. PPI  Prophylaxis: Heparin SQ  Disposition: pending clinical improvement  Subjective:  Patient has return of her chest pain. It is sharp and located mainly upper substernal with some radiation to lower substernal area and back. Pain is sometimes worse with breathing. It stars randomly and lasts for about one minute, but occurs every 5 or so minutes. She has no shortness of breath, nausea or vomiting.  Objective: Temp:  [97.8 F (36.6 C)-98.2 F (36.8 C)] 97.9 F (36.6 C) (06/16 0535) Pulse Rate:  [79-92] 83 (06/16 0535) Resp:  [16-18] 18 (06/16 0535) BP: (84-108)/(38-59) 108/59 mmHg (06/16 0535) SpO2:  [100 %] 100 % (06/15 2039) Weight:  [110 lb 7.2 oz (50.1 kg)] 110 lb 7.2 oz (50.1 kg) (06/16 0535)  Physical Exam: General: lying in bed, NAD Cardiovascular: Regular rhythm, regular rate, 2/6 systolic murmur that radiates to carotids , equal radial pulses bilaterally. No reproducible chest pain. Respiratory: Clear to auscultation bilaterally, no wheezing, no crackles. Abdomen: Soft, non distended. Non-tender Extremities: No cyanosis, no edema  Laboratory:  Recent Labs Lab 06/17/13 2357 06/22/13 1020 06/23/13 0533  WBC 5.8 3.2* 3.3*  HGB 10.7* 11.0* 11.3*  HCT 33.7* 34.5*  34.7*  PLT 138* 137* 127*    Recent Labs Lab 06/17/13 2357 06/18/13 0518 06/22/13 0500  NA 145  --   --   K 4.2  --   --   CL 107  --    --   CO2 24  --   --   BUN 16  --   --   CREATININE 0.33*  --   --   CALCIUM 9.4  --   --   PROT  --  6.9 7.6  BILITOT  --  0.6 0.5  ALKPHOS  --  246* 243*  ALT  --  28 24  AST  --  35 24  GLUCOSE 100*  --   --    TSH  Date Value Ref Range Status  06/18/2013 0.008* 0.350 - 4.500 uIU/mL Final     Performed at [facility]     Free T4  Date Value Ref Range Status  06/18/2013 6.66* 0.80 - 1.80 ng/dL Final     Performed at [facility]     T3, Free  Date Value Ref Range Status  06/18/2013 >20.0* 2.3 - 4.2 pg/mL Final     Performed at [facility]    Imaging/Diagnostic Tests: Dg Chest Port 1 View  06/23/2013   CLINICAL DATA:  Chest pain.  EXAM: PORTABLE CHEST - 1 VIEW  COMPARISON:  Chest radiograph and CTA of the chest performed 06/18/2013  FINDINGS: The lungs are well-aerated and clear. There is no evidence of focal opacification, pleural effusion or pneumothorax.  The cardiomediastinal silhouette is enlarged. No acute osseous abnormalities are seen.  IMPRESSION: No acute cardiopulmonary process seen.  Cardiomegaly noted.   Electronically Signed   By: Garald Balding M.D.   On: 06/23/2013 02:55    Cordelia Poche, MD 06/23/2013, 6:52 AM PGY-1, [place] Intern pager: [phone], text pages welcome

## 2013-06-24 DIAGNOSIS — I428 Other cardiomyopathies: Secondary | ICD-10-CM

## 2013-06-24 MED ORDER — IBUPROFEN 600 MG PO TABS: 600.0000 mg | ORAL_TABLET | Freq: Four times a day (QID) | ORAL | Status: DC | PRN

## 2013-06-24 NOTE — Progress Notes (Signed)
Pt discharged home with family.  Assessment unchanged from this morning.  Reviewed discharge instructions and education, all questions answered.

## 2013-06-24 NOTE — Discharge Instructions (Signed)
[name], we admitted you because of your chest pain. You do not have any sign of heart attack, but you do have new onset heart failure. This may or may not be related to your hyperthyroidism and fast heart rate. We have adjusted your medications. Please limit the amount of salt that you intake and take your weight daily. If you notice that you are having increased shortness of breath, increased weight gain and increased swelling, please contact your primary care physician to be seen. We hope heart failure is reversible but you will be following up with cardiology.   Heart Failure Heart failure means your heart has trouble pumping blood. This makes it hard for your body to work well. Heart failure is usually a long-term (chronic) condition. You must take good care of yourself and follow your doctor's treatment plan. HOME CARE  Take your heart medicine as told by your doctor.  Do not stop taking medicine unless your doctor tells you to.  Do not skip any dose of medicine.  Refill your medicines before they run out.  Take other medicines only as told by your doctor or pharmacist.  Stay active if told by your doctor. The elderly and people with severe heart failure should talk with a doctor about physical activity.  Eat heart healthy foods. Choose foods that are without trans fat and are low in saturated fat, cholesterol, and salt (sodium). This includes fresh or frozen fruits and vegetables, fish, lean meats, fat-free or low-fat dairy foods, whole grains, and high-fiber foods. Lentils and dried peas and beans (legumes) are also good choices.  Limit salt if told by your doctor.  Cook in a healthy way. Roast, grill, broil, bake, poach, steam, or stir-fry foods.  Limit fluids as told by your doctor.  Weigh yourself every morning. Do this after you pee (urinate) and before you eat breakfast. Write down your weight to give to your doctor.  Take your blood pressure and write it down if your  doctor tell you to.  Ask your doctor how to check your pulse. Check your pulse as told.  Lose weight if told by your doctor.  Stop smoking or chewing tobacco. Do not use gum or patches that help you quit without your doctor's approval.  Schedule and go to doctor visits as told.  Nonpregnant women should have no more than 1 drink a day. Men should have no more than 2 drinks a day. Talk to your doctor about drinking alcohol.  Stop illegal drug use.  Stay current with shots (immunizations).  Manage your health conditions as told by your doctor.  Learn to manage your stress.  Rest when you are tired.  If it is really hot outside:  Avoid intense activities.  Use air conditioning or fans, or get in a cooler place.  Avoid caffeine and alcohol.  Wear loose-fitting, lightweight, and light-colored clothing.  If it is really cold outside:  Avoid intense activities.  Layer your clothing.  Wear mittens or gloves, a hat, and a scarf when going outside.  Avoid alcohol.  Learn about heart failure and get support as needed.  Get help to maintain or improve your quality of life and your ability to care for yourself as needed. GET HELP IF:   You gain 03 lb/1.4 kg or more in 1 day or 05 lb/2.3 kg in a week.  You are more short of breath than usual.  You cannot do your normal activities.  You tire easily.  You cough  more than normal, especially with activity.  You have any or more puffiness (swelling) in areas such as your hands, feet, ankles, or belly (abdomen).  You cannot sleep because it is hard to breathe.  You feel like your heart is beating fast (palpitations).  You get dizzy or lightheaded when you stand up. GET HELP RIGHT AWAY IF:   You have trouble breathing.  There is a change in mental status, such as becoming less alert or not being able to focus.  You have chest pain or discomfort.  You faint. MAKE SURE YOU:   Understand these instructions.  Will  watch your condition.  Will get help right away if you are not doing well or get worse. Document Released: 10/04/2007 Document Revised: 04/21/2012 Document Reviewed: 07/26/2011 [facility] Patient Information 2014 [place], [place].  Hyperthyroidism The thyroid is a large gland located in the lower front part of your neck. The thyroid helps control metabolism. Metabolism is how your body uses food. It controls metabolism with the hormone thyroxine. When the thyroid is overactive, it produces too much hormone. When this happens, these following problems may occur:   Nervousness  Heat intolerance  Weight loss (in spite of increase food intake)  Diarrhea  Change in hair or skin texture  Palpitations (heart skipping or having extra beats)  Tachycardia (rapid heart rate)  Loss of menstruation (amenorrhea)  Shaking of the hands CAUSES  Grave's Disease (the immune system attacks the thyroid gland). This is the most common cause.  Inflammation of the thyroid gland.  Tumor (usually benign) in the thyroid gland or elsewhere.  Excessive use of thyroid medications (both prescription and 'natural').  Excessive ingestion of Iodine. DIAGNOSIS  To prove hyperthyroidism, your caregiver may do blood tests and ultrasound tests. Sometimes the signs are hidden. It may be necessary for your caregiver to watch this illness with blood tests, either before or after diagnosis and treatment. TREATMENT Short-term treatment There are several treatments to control symptoms. Drugs called beta blockers may give some relief. Drugs that decrease hormone production will provide temporary relief in many people. These measures will usually not give permanent relief. Definitive therapy There are treatments available which can be discussed between you and your caregiver which will permanently treat the problem. These treatments range from surgery (removal of the thyroid), to the use of radioactive iodine (destroys  the thyroid by radiation), to the use of antithyroid drugs (interfere with hormone synthesis). The first two treatments are permanent and usually successful. They most often require hormone replacement therapy for life. This is because it is impossible to remove or destroy the exact amount of thyroid required to make a person euthyroid (normal). HOME CARE INSTRUCTIONS  See your caregiver if the problems you are being treated for get worse. Examples of this would be the problems listed above. SEEK MEDICAL CARE IF: Your general condition worsens. MAKE SURE YOU:   Understand these instructions.  Will watch your condition.  Will get help right away if you are not doing well or get worse. Document Released: 12/25/2004 Document Revised: 03/19/2011 Document Reviewed: 05/08/2006 [facility] Patient Information 2014 [place], [place].  Chest Pain (Nonspecific) It is often hard to give a specific diagnosis for the cause of chest pain. There is always a chance that your pain could be related to something serious, such as a heart attack or a blood clot in the lungs. You need to follow up with your caregiver for further evaluation. CAUSES   Heartburn.  Pneumonia or bronchitis.  Anxiety or stress.  Inflammation around your heart (pericarditis) or lung (pleuritis or pleurisy).  A blood clot in the lung.  A collapsed lung (pneumothorax). It can develop suddenly on its own (spontaneous pneumothorax) or from injury (trauma) to the chest.  Shingles infection (herpes zoster virus). The chest wall is composed of bones, muscles, and cartilage. Any of these can be the source of the pain.  The bones can be bruised by injury.  The muscles or cartilage can be strained by coughing or overwork.  The cartilage can be affected by inflammation and become sore (costochondritis). DIAGNOSIS  Lab tests or other studies, such as X-rays, electrocardiography, stress testing, or cardiac imaging, may be needed to find  the cause of your pain.  TREATMENT   Treatment depends on what may be causing your chest pain. Treatment may include:  Acid blockers for heartburn.  Anti-inflammatory medicine.  Pain medicine for inflammatory conditions.  Antibiotics if an infection is present.  You may be advised to change lifestyle habits. This includes stopping smoking and avoiding alcohol, caffeine, and chocolate.  You may be advised to keep your head raised (elevated) when sleeping. This reduces the chance of acid going backward from your stomach into your esophagus.  Most of the time, nonspecific chest pain will improve within 2 to 3 days with rest and mild pain medicine. HOME CARE INSTRUCTIONS   If antibiotics were prescribed, take your antibiotics as directed. Finish them even if you start to feel better.  For the next few days, avoid physical activities that bring on chest pain. Continue physical activities as directed.  Do not smoke.  Avoid drinking alcohol.  Only take over-the-counter or prescription medicine for pain, discomfort, or fever as directed by your caregiver.  Follow your caregiver's suggestions for further testing if your chest pain does not go away.  Keep any follow-up appointments you made. If you do not go to an appointment, you could develop lasting (chronic) problems with pain. If there is any problem keeping an appointment, you must call to reschedule. SEEK MEDICAL CARE IF:   You think you are having problems from the medicine you are taking. Read your medicine instructions carefully.  Your chest pain does not go away, even after treatment.  You develop a rash with blisters on your chest. SEEK IMMEDIATE MEDICAL CARE IF:   You have increased chest pain or pain that spreads to your arm, neck, jaw, back, or abdomen.  You develop shortness of breath, an increasing cough, or you are coughing up blood.  You have severe back or abdominal pain, feel nauseous, or vomit.  You develop  severe weakness, fainting, or chills.  You have a fever. THIS IS AN EMERGENCY. Do not wait to see if the pain will go away. Get medical help at once. Call your local emergency services (911 in [place] Do not drive yourself to the hospital. MAKE SURE YOU:   Understand these instructions.  Will watch your condition.  Will get help right away if you are not doing well or get worse. Document Released: 10/04/2004 Document Revised: 03/19/2011 Document Reviewed: 07/31/2007 [facility] Patient Information 2014 [place], [place].

## 2013-06-26 NOTE — Discharge Summary (Signed)
Family Medicine Teaching Service  Discharge Note : Attending Renold Don MD Pager [phone] Inpatient Team Pager:  [phone]  I have reviewed this patient and the patient's chart and have discussed discharge planning with the resident at the time of discharge. I agree with the discharge plan as above.  Pain reproducible and improved NSAIDs on day of DC, felt to be costochondritis.  No long-term NSAIDs in CHF patient.

## 2013-06-29 ENCOUNTER — Ambulatory Visit (INDEPENDENT_AMBULATORY_CARE_PROVIDER_SITE_OTHER): Payer: PRIVATE HEALTH INSURANCE | Admitting: Family Medicine

## 2013-06-29 ENCOUNTER — Encounter: Payer: Self-pay | Admitting: Family Medicine

## 2013-06-29 VITALS — BP 109/64 | HR 75 | Temp 98.3°F | Wt 114.0 lb

## 2013-06-29 DIAGNOSIS — D509 Iron deficiency anemia, unspecified: Secondary | ICD-10-CM

## 2013-06-29 DIAGNOSIS — E0590 Thyrotoxicosis, unspecified without thyrotoxic crisis or storm: Secondary | ICD-10-CM

## 2013-06-29 DIAGNOSIS — R079 Chest pain, unspecified: Secondary | ICD-10-CM

## 2013-06-29 MED ORDER — METHIMAZOLE 10 MG PO TABS: 10.0000 mg | ORAL_TABLET | Freq: Three times a day (TID) | ORAL | Status: DC

## 2013-06-29 NOTE — Patient Instructions (Addendum)
Call Cardiology and make an appointment  Keep your appointment with Dr Gayla Doss  Continue taking all the medications as you are  If your chest pain or shortness of breath gets worse then come back or call us

## 2013-06-29 NOTE — Assessment & Plan Note (Signed)
Stable with weight slightly improved and tachycardia resolved.  Continue current medications wit plan as per DC summary  "Recommend thyroid ablation for patient since she has been non-compliant with medication. Patient needs uptake and scan before ablation. Patient can only get this after 6 weeks of no iodine contrast exposure. Last exposure 6/13. Must also be off methimazole for a week before study. Recommend setting uptake and scan around last week of July to first week of August. Call [phone] to schedule uptake and scan and arrange ablation with nuclear medicine"

## 2013-06-29 NOTE — Progress Notes (Signed)
   Subjective:    Patient ID: Julie Ware, female    DOB: 1955-11-30, 58 y.o.   MRN: [id number]  HPI  Here for hospital follow up for:  Chest pain: Feels this is some better but still has pain with lifting arms, lying flat on back or walking.  Ibuprofen helps a little.  No cough or fever or leg swelling   Hyperthyroidism: Taking methiamazole three times daily and metoprolol 100 mg twice daily with lightheadness or edema Understands will need to stop methiamazole near the end of July for scan  HFrEF:  Shortness of breath is stable.  No edema.  Not on any diuretics  Abdominal pain: resovled  Anemia: taking iron regularly brought bottle  Chief Complaint noted Review of Symptoms - see HPI PMH - Smoking status noted.     Review of Systems     Objective:   Physical Exam  Alert no acute distress Heart - Regular rate and rhythm.  Gr 2/6 systolic murmur    Lungs:  Normal respiratory effort, chest expands symmetrically. Lungs are clear to auscultation, no crackles or wheezes. Extremities:  No cyanosis, edema, or deformity noted with good range of motion of all major joints.   Chest - tender to palpation over sternum no deformity      Assessment & Plan:

## 2013-07-14 ENCOUNTER — Ambulatory Visit (INDEPENDENT_AMBULATORY_CARE_PROVIDER_SITE_OTHER): Payer: PRIVATE HEALTH INSURANCE | Admitting: Family Medicine

## 2013-07-14 ENCOUNTER — Encounter: Payer: Self-pay | Admitting: Family Medicine

## 2013-07-14 VITALS — BP 109/67 | HR 75 | Temp 98.4°F | Wt 119.0 lb

## 2013-07-14 DIAGNOSIS — E0590 Thyrotoxicosis, unspecified without thyrotoxic crisis or storm: Secondary | ICD-10-CM

## 2013-07-14 MED ORDER — ASPIRIN 325 MG PO TBEC: 325.0000 mg | DELAYED_RELEASE_TABLET | Freq: Every day | ORAL | Status: DC

## 2013-07-14 MED ORDER — LISINOPRIL 2.5 MG PO TABS: 2.5000 mg | ORAL_TABLET | Freq: Every day | ORAL | Status: DC

## 2013-07-14 MED ORDER — METOPROLOL TARTRATE 50 MG PO TABS: 100.0000 mg | ORAL_TABLET | Freq: Two times a day (BID) | ORAL | Status: DC

## 2013-07-14 MED ORDER — METHIMAZOLE 10 MG PO TABS: 10.0000 mg | ORAL_TABLET | Freq: Three times a day (TID) | ORAL | Status: DC

## 2013-07-14 MED ORDER — FERROUS SULFATE 325 (65 FE) MG PO TABS: 325.0000 mg | ORAL_TABLET | Freq: Two times a day (BID) | ORAL | Status: DC

## 2013-07-14 NOTE — Patient Instructions (Signed)
It was great seeing you today.   1. It is very important that you take your thyroid medications everyday; please call us if your have any problems getting or affording this medication. 2. I will look into the Ablation and Endocrine referral I made for you last visit, and call you to set it up    Please bring all your medications to every doctors visit  Sign up for My Chart to have easy access to your labs results, and communication with your Primary care physician.  Next Appointment  Please call to make an appointment with Dr Gayla Doss in 3 months, or sooner if needed  I look forward to talking with you again at our next visit. If you have any questions or concerns before then, please call the clinic at [phone].  Take Care,   Dr Wenda Low

## 2013-07-15 NOTE — Assessment & Plan Note (Signed)
Stable - Desire to proceed with ablation; Discussed importance of taking daily thyroid medication until surgery, and likely need to take daily thyroid replacement meds post surgery - Will call to schedule ablation, and call pt back with instructions for stopping Methimazole prior

## 2013-07-15 NOTE — Progress Notes (Signed)
  Patient name: Julie Ware MRN [id number]  Date of birth: 25-Jul-1955  CC & HPI:  Julie Ware is a 58 y.o. female presenting today for hyperthyroidism f/u.   She reports compliance with Methimazole and denies current CP, SOB, palpitation, anxiety or insomnia. She does endore some globus sensation but denies dysphagia.   ROS: See HPI above otherwise negative.  Medical & Surgical Hx:  Reviewed.  Medications & Allergies: Reviewed & Updated - see associated section Social History: Reviewed:   Objective Findings:  Vitals: BP 109/67  Pulse 75  Temp(Src) 98.4 F (36.9 C) (Oral)  Wt 119 lb (53.978 kg)  Gen: NAD; Thin Neck: Goiter present CV: RRR w/o m/r/g, pulses +2 b/l Resp: CTAB w/ normal respiratory effort  Assessment & Plan:   Please See Problem Focused Assessment & Plan

## 2013-07-16 ENCOUNTER — Other Ambulatory Visit: Payer: Self-pay | Admitting: Family Medicine

## 2013-07-16 DIAGNOSIS — E0500 Thyrotoxicosis with diffuse goiter without thyrotoxic crisis or storm: Secondary | ICD-10-CM

## 2013-07-16 NOTE — Progress Notes (Signed)
Called IR who recommended stopping Methimazole for 7 days prior to surgery. Do to her new tachycardia induced Heart failure I opted to not schedule her ablation and re-refer her to Endocrinology. I called [facility]: [address] [place], [place], [place] [address] [phone]. Who is aware of the patient and requested to see patient prior to any definitive therapy. She may need to have Grave's disease well controlled and hopefully improved HF / EF prior to ablation or may opt for surgical thyroidectomy.   - Urgent Endocrine referral made today - Called pt and updated her to plan, and asked her to call if she doesn't hear from endocrine in the next week.

## 2013-08-27 ENCOUNTER — Ambulatory Visit (INDEPENDENT_AMBULATORY_CARE_PROVIDER_SITE_OTHER): Payer: PRIVATE HEALTH INSURANCE | Admitting: Cardiology

## 2013-08-27 ENCOUNTER — Encounter: Payer: Self-pay | Admitting: Cardiology

## 2013-08-27 VITALS — BP 92/60 | HR 79 | Ht 63.0 in | Wt 116.0 lb

## 2013-08-27 DIAGNOSIS — E0590 Thyrotoxicosis, unspecified without thyrotoxic crisis or storm: Secondary | ICD-10-CM

## 2013-08-27 DIAGNOSIS — I059 Rheumatic mitral valve disease, unspecified: Secondary | ICD-10-CM

## 2013-08-27 DIAGNOSIS — I420 Dilated cardiomyopathy: Secondary | ICD-10-CM

## 2013-08-27 DIAGNOSIS — I509 Heart failure, unspecified: Secondary | ICD-10-CM

## 2013-08-27 DIAGNOSIS — I340 Nonrheumatic mitral (valve) insufficiency: Secondary | ICD-10-CM | POA: Insufficient documentation

## 2013-08-27 DIAGNOSIS — I428 Other cardiomyopathies: Secondary | ICD-10-CM

## 2013-08-27 DIAGNOSIS — I5022 Chronic systolic (congestive) heart failure: Secondary | ICD-10-CM | POA: Insufficient documentation

## 2013-08-27 DIAGNOSIS — I272 Pulmonary hypertension, unspecified: Secondary | ICD-10-CM | POA: Insufficient documentation

## 2013-08-27 DIAGNOSIS — I2789 Other specified pulmonary heart diseases: Secondary | ICD-10-CM

## 2013-08-27 NOTE — Patient Instructions (Signed)
Your physician recommends that you continue on your current medications as directed. Please refer to the Current Medication list given to you today.  Your physician has requested that you have an echocardiogram. Echocardiography is a painless test that uses sound waves to create images of your heart. It provides your doctor with information about the size and shape of your heart and how well your heart's chambers and valves are working. This procedure takes approximately one hour. There are no restrictions for this procedure.  Your physician recommends that you schedule a follow-up appointment in: 3 months with Dr. Turner.  

## 2013-08-27 NOTE — Progress Notes (Signed)
[address] [place], [place]  [address] Phone: [phone] Fax:  [phone]  Date:  08/27/2013   ID:  Julie Ware, DOB 01/11/1955, MRN [id number]  PCP:  Wenda Low, MD  Cardiologist:  Armanda Magic, MD     History of Present Illness: Julie Ware is a 58 y.o. female who presented to [facility] in June with chest pain.  She ruled out for MI and her pain was felt to not be cardiac in origin.  She was found to be hyperthyroid during her stay.  She was found to have new acute systolic CHF with echo showing EF 35-40% and moderate pulmonary HTN along with moderate AR/MR/PR and TR.  She was started on Lisinopril.  It was recommended that she have a thyroid ablation as an outpt due to noncompliance with meds.  She is doing well.  Her endocrinologist is waiting for her thyroid levels to come down before doing a thyroid ablation.  She denies any chest pain, SOB, DOE, dizziness, LE edema, or syncope.  She occasionally has some mild LE edema.  Wt Readings from Last 3 Encounters:  08/27/13 116 lb (52.617 kg)  07/14/13 119 lb (53.978 kg)  06/29/13 114 lb (51.71 kg)     Past Medical History  Diagnosis Date  . Graves disease 01/2013  . Anxiety   . Microcytic anemia 09/2011  . Thrombocytopenia 06/2011    Current Outpatient Prescriptions  Medication Sig Dispense Refill  . ferrous sulfate 325 (65 FE) MG tablet Take 1 tablet (325 mg total) by mouth 2 (two) times daily with a meal.  60 tablet  2  . ibuprofen (ADVIL,MOTRIN) 600 MG tablet Take 1 tablet (600 mg total) by mouth every 6 (six) hours as needed for mild pain or moderate pain.  30 tablet  0  . lisinopril (PRINIVIL,ZESTRIL) 2.5 MG tablet Take 1 tablet (2.5 mg total) by mouth daily.  30 tablet  2  . methimazole (TAPAZOLE) 10 MG tablet Take 10 mg by mouth 6 (six) times daily.      . metoprolol (LOPRESSOR) 50 MG tablet Take 2 tablets (100 mg total) by mouth 2 (two) times daily.  60 tablet  2   No current facility-administered  medications for this visit.    Allergies:    Allergies  Allergen Reactions  . Tylenol [Acetaminophen] Anaphylaxis and Rash    Social History:  The patient  reports that she has never smoked. She has never used smokeless tobacco. She reports that she does not drink alcohol or use illicit drugs.   Family History:  The patient's family history includes Diabetes in her father, maternal grandfather, mother, and paternal grandmother; Hyperlipidemia in her father and mother; Hypertension in her father, maternal grandfather, mother, and paternal grandmother.   ROS:  Please see the history of present illness.      All other systems reviewed and negative.   PHYSICAL EXAM: VS:  BP 92/60  Pulse 79  Ht 5\' 3"  (1.6 m)  Wt 116 lb (52.617 kg)  BMI 20.55 kg/m2 Well nourished, well developed, in no acute distress HEENT: normal Neck: no JVD Cardiac:  normal S1, S2; RRR; no murmur Lungs:  clear to auscultation bilaterally, no wheezing, rhonchi or rales Abd: soft, nontender, no hepatomegaly Ext: no edema Skin: warm and dry Neuro:  CNs 2-12 intact, no focal abnormalities noted       ASSESSMENT AND PLAN:  1. Chronic systolic CHF - appears euvolemic today - continue Lisinopril and metoprolol 2. 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 Secondary to hyperthyroidism vs. Tachycardia - will recheck 2D echo to see if LVF has improved after treatment for hyperthyroidism and CHF 3. 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

## 2013-08-31 ENCOUNTER — Telehealth: Payer: Self-pay | Admitting: *Deleted

## 2013-08-31 NOTE — Telephone Encounter (Signed)
LM for patient to call back.  Please give message from MD when she calls back.  Thanks [employer]

## 2013-08-31 NOTE — Telephone Encounter (Signed)
Pt does see an endocrinologist, see has appt next month (although she could not remember the date/time as her calender was not in front of her).  She will have them send [place] records. Kaniel Kiang, Maryjo Rochester

## 2013-08-31 NOTE — Telephone Encounter (Signed)
Message copied by Henri Medal on Mon Aug 31, 2013  4:07 PM ------      Message from: Jamal Collin      Created: Mon Aug 31, 2013  3:43 PM       Please call and determine if she has seen an Endocrinologist about her Hyperthyroidism and if so have her send [place] their visit summary / recommendations. If not, she needs to see one ASAP ------

## 2013-09-01 ENCOUNTER — Ambulatory Visit (HOSPITAL_COMMUNITY): Payer: PRIVATE HEALTH INSURANCE | Attending: Cardiology | Admitting: Radiology

## 2013-09-01 DIAGNOSIS — I5022 Chronic systolic (congestive) heart failure: Secondary | ICD-10-CM | POA: Diagnosis present

## 2013-09-01 DIAGNOSIS — I080 Rheumatic disorders of both mitral and aortic valves: Secondary | ICD-10-CM | POA: Insufficient documentation

## 2013-09-01 NOTE — Progress Notes (Signed)
Echocardiogram performed.  

## 2013-09-04 ENCOUNTER — Other Ambulatory Visit: Payer: Self-pay | Admitting: General Surgery

## 2013-09-04 DIAGNOSIS — I340 Nonrheumatic mitral (valve) insufficiency: Secondary | ICD-10-CM

## 2013-11-27 ENCOUNTER — Ambulatory Visit: Payer: PRIVATE HEALTH INSURANCE | Admitting: Cardiology

## 2013-12-15 ENCOUNTER — Encounter: Payer: Self-pay | Admitting: Cardiology

## 2014-03-08 ENCOUNTER — Other Ambulatory Visit (HOSPITAL_COMMUNITY): Payer: PRIVATE HEALTH INSURANCE

## 2014-04-07 ENCOUNTER — Ambulatory Visit: Payer: Self-pay | Admitting: Family Medicine

## 2014-04-16 ENCOUNTER — Emergency Department (HOSPITAL_COMMUNITY)
Admission: EM | Admit: 2014-04-16 | Discharge: 2014-04-16 | Disposition: A | Discharge status: Home / Self Care | Payer: PRIVATE HEALTH INSURANCE | Attending: Emergency Medicine | Admitting: Emergency Medicine

## 2014-04-16 ENCOUNTER — Encounter (HOSPITAL_COMMUNITY): Payer: Self-pay | Admitting: Emergency Medicine

## 2014-04-16 ENCOUNTER — Emergency Department (HOSPITAL_COMMUNITY): Payer: PRIVATE HEALTH INSURANCE

## 2014-04-16 DIAGNOSIS — Z862 Personal history of diseases of the blood and blood-forming organs and certain disorders involving the immune mechanism: Secondary | ICD-10-CM | POA: Insufficient documentation

## 2014-04-16 DIAGNOSIS — F419 Anxiety disorder, unspecified: Secondary | ICD-10-CM | POA: Diagnosis not present

## 2014-04-16 DIAGNOSIS — Y9289 Other specified places as the place of occurrence of the external cause: Secondary | ICD-10-CM | POA: Insufficient documentation

## 2014-04-16 DIAGNOSIS — W25XXXA Contact with sharp glass, initial encounter: Secondary | ICD-10-CM | POA: Diagnosis not present

## 2014-04-16 DIAGNOSIS — Z8659 Personal history of other mental and behavioral disorders: Secondary | ICD-10-CM | POA: Diagnosis not present

## 2014-04-16 DIAGNOSIS — S91311A Laceration without foreign body, right foot, initial encounter: Secondary | ICD-10-CM | POA: Insufficient documentation

## 2014-04-16 DIAGNOSIS — Z8639 Personal history of other endocrine, nutritional and metabolic disease: Secondary | ICD-10-CM | POA: Insufficient documentation

## 2014-04-16 DIAGNOSIS — Y9389 Activity, other specified: Secondary | ICD-10-CM | POA: Insufficient documentation

## 2014-04-16 DIAGNOSIS — IMO0002 Reserved for concepts with insufficient information to code with codable children: Secondary | ICD-10-CM

## 2014-04-16 DIAGNOSIS — Y998 Other external cause status: Secondary | ICD-10-CM | POA: Insufficient documentation

## 2014-04-16 DIAGNOSIS — Z23 Encounter for immunization: Secondary | ICD-10-CM | POA: Diagnosis not present

## 2014-04-16 DIAGNOSIS — Z79899 Other long term (current) drug therapy: Secondary | ICD-10-CM | POA: Diagnosis not present

## 2014-04-16 DIAGNOSIS — Z791 Long term (current) use of non-steroidal anti-inflammatories (NSAID): Secondary | ICD-10-CM | POA: Diagnosis not present

## 2014-04-16 MED ORDER — LIDOCAINE-EPINEPHRINE (PF) 2 %-1:200000 IJ SOLN: 10.0000 mL | Freq: Once | INTRAMUSCULAR | Status: DC

## 2014-04-16 MED ORDER — TETANUS-DIPHTH-ACELL PERTUSSIS 5-2.5-18.5 LF-MCG/0.5 IM SUSP
0.5000 mL | Freq: Once | INTRAMUSCULAR | Status: AC
Start: 2014-04-16 — End: 2014-04-16
  Administered 2014-04-16: 0.5 mL via INTRAMUSCULAR
  Filled 2014-04-16: qty 0.5

## 2014-04-16 MED ORDER — BACITRACIN 500 UNIT/GM EX OINT
1.0000 "application " | TOPICAL_OINTMENT | Freq: Two times a day (BID) | CUTANEOUS | Status: DC
  Filled 2014-04-16 (×2): qty 0.9

## 2014-04-16 MED ORDER — LIDOCAINE-EPINEPHRINE 2 %-1:100000 IJ SOLN
INTRAMUSCULAR | Status: AC
  Filled 2014-04-16: qty 1

## 2014-04-16 NOTE — BH Assessment (Addendum)
Tele Assessment Note   Julie Ware is an 59 y.o. female presenting to [facility] with her daughter and sister in law. Pt reported that she dropped a glass table on her foot earlier today. Pt denies SI, HI and AVH at this time. Pt did not report any previous suicide attempts or psychiatric hospitalizations. Pt reported that she attended a therapy session at [facility] [place]; however she has not been back due to scheduling. Pt is endorsing some depressive symptoms and shared that her sleep and appetite have been poor. Pt did not report any alcohol or illicit substance abuse at this time. Pt did not report any physical, sexual or emotional abuse at this time. Pt reported that she is aware that she has a problem gambling and will work on it eventually. Pt shared that she is tired and wants to get away because everyone is telling her how to live her life. Pt reported that she has experienced a lot of loss in her life over the past several years. Pt reported that her husband left her; however they are still friends and he is helpful. She also reported that she moved out of her home after the separation and lost her car approximately 1 year ago. Pt daughter reported that her mother has changed and she no longer recognizes the woman her mother is now. She reported that her mother has never stolen anything and she recently stole a deposit from work and gambled it away. She also shared that her mother has been gambling away her paychecks and having mood swings. She reported that her mother will be nonchalant one moment and the next time crying. Pt does not meet criteria for inpatient treatment and it is recommended that she follow up with her outpatient provider.   Axis I: Mood Disorder NOS  Past Medical History:  Past Medical History  Diagnosis Date  . Graves disease 01/2013  . Anxiety   . Microcytic anemia 09/2011  . Thrombocytopenia 06/2011    Past Surgical History  Procedure Laterality Date  .  Cesarean section      X 3 (1978, 1980, 1992)  . Tubal ligation  1992  . Endometrial biopsy  2003    benign pathology.     Family History:  Family History  Problem Relation Age of Onset  . Diabetes Mother   . Hypertension Mother   . Hyperlipidemia Mother   . Hyperlipidemia Father   . Hypertension Father   . Diabetes Father   . Hypertension Paternal Grandmother   . Diabetes Paternal Grandmother   . Hypertension Maternal Grandfather   . Diabetes Maternal Grandfather     Social History:  reports that she has never smoked. She has never used smokeless tobacco. She reports that she does not drink alcohol or use illicit drugs.  Additional Social History:  Alcohol / Drug Use History of alcohol / drug use?: No history of alcohol / drug abuse  CIWA: CIWA-Ar BP: 122/70 mmHg Pulse Rate: 98 COWS:    PATIENT STRENGTHS: (choose at least two) Average or above average intelligence Supportive family/friends  Allergies:  Allergies  Allergen Reactions  . Tylenol [Acetaminophen] Anaphylaxis and Rash    Home Medications:  (Not in a hospital admission)  OB/GYN Status:  No LMP recorded. Patient is postmenopausal.  General Assessment Data Location of Assessment: [facility] Is this a Tele or Face-to-Face Assessment?: Face-to-Face Is this an Initial Assessment or a Re-assessment for this encounter?: Initial Assessment Living Arrangements: Alone Can pt  return to current living arrangement?: Yes Admission Status: Voluntary Is patient capable of signing voluntary admission?: Yes Transfer from: Home Referral Source: Self/Family/Friend     [facility] Crisis Care Plan Living Arrangements: Alone Name of Psychiatrist: No provider reported at this time. Name of Therapist: No provider reported at this time.  Education Status Is patient currently in school?: No  Risk to self with the past 6 months Suicidal Ideation: No Suicidal Intent: No Is patient at risk for suicide?: No Suicidal Plan?:  No Access to Means: No What has been your use of drugs/alcohol within the last 12 months?: No drugs or alcohol use reported.  Previous Attempts/Gestures: No How many times?: 0 Other Self Harm Risks: No self-harm risk reported  Triggers for Past Attempts: None known Intentional Self Injurious Behavior: None Family Suicide History: No Recent stressful life event(s):  (No stressors reported. ) Persecutory voices/beliefs?: No Depression: Yes Depression Symptoms: Fatigue, Guilt, Tearfulness, Loss of interest in usual pleasures, Feeling worthless/self pity, Isolating, Feeling angry/irritable Substance abuse history and/or treatment for substance abuse?: No Suicide prevention information given to non-admitted patients: Not applicable  Risk to Others within the past 6 months Homicidal Ideation: No Thoughts of Harm to Others: No Current Homicidal Intent: No Current Homicidal Plan: No Access to Homicidal Means: No Identified Victim: NA History of harm to others?: No Assessment of Violence: On admission Violent Behavior Description: No violent behaviors observed at this time. Does patient have access to weapons?: No Criminal Charges Pending?: No Does patient have a court date: No  Psychosis Hallucinations: None noted Delusions: None noted  Mental Status Report Appearance/Hygiene:  (Dressed appropriate) Eye Contact: Good Motor Activity: Freedom of movement Speech: Logical/coherent Level of Consciousness: Alert Mood: Pleasant Affect: Appropriate to circumstance Anxiety Level: Minimal Thought Processes: Coherent, Relevant Judgement: Unimpaired Orientation: Situation, Time, Place, Person Obsessive Compulsive Thoughts/Behaviors: None  Cognitive Functioning Concentration: Normal Memory: Recent Intact, Remote Intact IQ: Average Insight: Good Impulse Control: Good Appetite: Poor Weight Loss: 0 Weight Gain: 0 Sleep: Decreased Total Hours of Sleep: 5 Vegetative Symptoms: Staying  in bed  ADLScreening [facility] Assessment Services) Patient's cognitive ability adequate to safely complete daily activities?: Yes Patient able to express need for assistance with ADLs?: Yes Independently performs ADLs?: Yes (appropriate for developmental age)  Prior Inpatient Therapy Prior Inpatient Therapy: No  Prior Outpatient Therapy Prior Outpatient Therapy: Yes Prior Therapy Dates: March 2016 [facility] Reason for Treatment: Bipolar sxs  ADL Screening (condition at time of admission) Patient's cognitive ability adequate to safely complete daily activities?: Yes Patient able to express need for assistance with ADLs?: Yes Independently performs ADLs?: Yes (appropriate for developmental age)       Abuse/Neglect Assessment (Assessment to be complete while patient is alone) Physical Abuse: Denies Verbal Abuse: Denies Sexual Abuse: Denies Exploitation of patient/patient's resources: Denies Self-Neglect: Denies     Merchant navy officer (For Healthcare) Does patient have an advance directive?: No    Additional Information 1:1 In Past 12 Months?: No CIRT Risk: No Elopement Risk: No Does patient have medical clearance?: Yes     Disposition:  Disposition Initial Assessment Completed for this Encounter: Yes Disposition of Patient: Outpatient treatment Type of outpatient treatment: Adult  Matisse Roskelley S 04/16/2014 8:44 PM

## 2014-04-16 NOTE — Discharge Instructions (Signed)
You need follow-up with orthopedics in 1 week for recheck and for suture removal. Please wear the postop shoe until then. Please take ibuprofen and use ice for pain.  Laceration Care, Adult A laceration is a cut or lesion that goes through all layers of the skin and into the tissue just beneath the skin. TREATMENT  Some lacerations may not require closure. Some lacerations may not be able to be closed due to an increased risk of infection. It is important to see your caregiver as soon as possible after an injury to minimize the risk of infection and maximize the opportunity for successful closure. If closure is appropriate, pain medicines may be given, if needed. The wound will be cleaned to help prevent infection. Your caregiver will use stitches (sutures), staples, wound glue (adhesive), or skin adhesive strips to repair the laceration. These tools bring the skin edges together to allow for faster healing and a better cosmetic outcome. However, all wounds will heal with a scar. Once the wound has healed, scarring can be minimized by covering the wound with sunscreen during the day for 1 full year. HOME CARE INSTRUCTIONS  For sutures or staples:  Keep the wound clean and dry.  If you were given a bandage (dressing), you should change it at least once a day. Also, change the dressing if it becomes wet or dirty, or as directed by your caregiver.  Wash the wound with soap and water 2 times a day. Rinse the wound off with water to remove all soap. Pat the wound dry with a clean towel.  After cleaning, apply a thin layer of the antibiotic ointment as recommended by your caregiver. This will help prevent infection and keep the dressing from sticking.  You may shower as usual after the first 24 hours. Do not soak the wound in water until the sutures are removed.  Only take over-the-counter or prescription medicines for pain, discomfort, or fever as directed by your caregiver.  Get your sutures or  staples removed as directed by your caregiver. For skin adhesive strips:  Keep the wound clean and dry.  Do not get the skin adhesive strips wet. You may bathe carefully, using caution to keep the wound dry.  If the wound gets wet, pat it dry with a clean towel.  Skin adhesive strips will fall off on their own. You may trim the strips as the wound heals. Do not remove skin adhesive strips that are still stuck to the wound. They will fall off in time. For wound adhesive:  You may briefly wet your wound in the shower or bath. Do not soak or scrub the wound. Do not swim. Avoid periods of heavy perspiration until the skin adhesive has fallen off on its own. After showering or bathing, gently pat the wound dry with a clean towel.  Do not apply liquid medicine, cream medicine, or ointment medicine to your wound while the skin adhesive is in place. This may loosen the film before your wound is healed.  If a dressing is placed over the wound, be careful not to apply tape directly over the skin adhesive. This may cause the adhesive to be pulled off before the wound is healed.  Avoid prolonged exposure to sunlight or tanning lamps while the skin adhesive is in place. Exposure to ultraviolet light in the first year will darken the scar.  The skin adhesive will usually remain in place for 5 to 10 days, then naturally fall off the skin. Do not  pick at the adhesive film. You may need a tetanus shot if:  You cannot remember when you had your last tetanus shot.  You have never had a tetanus shot. If you get a tetanus shot, your arm may swell, get red, and feel warm to the touch. This is common and not a problem. If you need a tetanus shot and you choose not to have one, there is a rare chance of getting tetanus. Sickness from tetanus can be serious. SEEK MEDICAL CARE IF:   You have redness, swelling, or increasing pain in the wound.  You see a red line that goes away from the wound.  You have  yellowish-white fluid (pus) coming from the wound.  You have a fever.  You notice a bad smell coming from the wound or dressing.  Your wound breaks open before or after sutures have been removed.  You notice something coming out of the wound such as wood or glass.  Your wound is on your hand or foot and you cannot move a finger or toe. SEEK IMMEDIATE MEDICAL CARE IF:   Your pain is not controlled with prescribed medicine.  You have severe swelling around the wound causing pain and numbness or a change in color in your arm, hand, leg, or foot.  Your wound splits open and starts bleeding.  You have worsening numbness, weakness, or loss of function of any joint around or beyond the wound.  You develop painful lumps near the wound or on the skin anywhere on your body. MAKE SURE YOU:   Understand these instructions.  Will watch your condition.  Will get help right away if you are not doing well or get worse. Document Released: 12/25/2004 Document Revised: 03/19/2011 Document Reviewed: 06/20/2010 [facility] Patient Information 2015 [place], [place]. This information is not intended to replace advice given to you by your health care provider. Make sure you discuss any questions you have with your health care provider.

## 2014-04-16 NOTE — ED Notes (Signed)
Julie Ware at bedside for TTS consult

## 2014-04-16 NOTE — ED Notes (Signed)
Pt brought by EMS for laceration to anterior foot, pulse present, measured via doppler, sensation intact, pt states she is unable to move toes. EMS administered 150 mcg Fentanyl en route. Initial pain was 10/10, has decreased to 4/10, per EMS.

## 2014-04-16 NOTE — ED Notes (Signed)
Bed: WC37 Expected date:  Expected time:  Means of arrival:  Comments: EMS-foot lac, numbness

## 2014-04-16 NOTE — ED Notes (Signed)
Ortho tech at bedside 

## 2014-04-16 NOTE — BH Assessment (Signed)
Assessment completed. Consulted Birdena Jubilee, NP who agrees that pt does not meet inpatient criteria at this time. It is recommended that pt follow up with her outpatient provider. Pt has also been provided with a list of outpatient resources as well. Ivar Drape, PA-C has been informed of the recommendation.

## 2014-04-16 NOTE — ED Provider Notes (Signed)
CSN: [id number]     Arrival date & time 04/16/14  1707 History   First MD Initiated Contact with Patient 04/16/14 1722     Chief Complaint  Patient presents with  . Extremity Laceration     (Consider location/radiation/quality/duration/timing/severity/associated sxs/prior Treatment) HPI Comments: Patient presents to the emergency department with chief complaint laceration to foot. Patient states that she dropped a glass table on her foot. She reports moderate pain. States she is unable to move her toes secondary to pain. Last tetanus shot is unknown. Patient denies any other symptoms.    The history is provided by the patient. No language interpreter was used.    Past Medical History  Diagnosis Date  . Graves disease 01/2013  . Anxiety   . Microcytic anemia 09/2011  . Thrombocytopenia 06/2011   Past Surgical History  Procedure Laterality Date  . Cesarean section      X 3 (1978, 1980, 1992)  . Tubal ligation  1992  . Endometrial biopsy  2003    benign pathology.    Family History  Problem Relation Age of Onset  . Diabetes Mother   . Hypertension Mother   . Hyperlipidemia Mother   . Hyperlipidemia Father   . Hypertension Father   . Diabetes Father   . Hypertension Paternal Grandmother   . Diabetes Paternal Grandmother   . Hypertension Maternal Grandfather   . Diabetes Maternal Grandfather    History  Substance Use Topics  . Smoking status: Never Smoker   . Smokeless tobacco: Never Used  . Alcohol Use: No   OB History    No data available     Review of Systems  Constitutional: Negative for fever and chills.  Respiratory: Negative for shortness of breath.   Cardiovascular: Negative for chest pain.  Gastrointestinal: Negative for nausea, vomiting, diarrhea and constipation.  Genitourinary: Negative for dysuria.  Skin: Positive for wound.  Psychiatric/Behavioral: The patient is nervous/anxious.       Allergies  Tylenol  Home Medications   Prior to  Admission medications   Medication Sig Start Date End Date Taking? Authorizing Provider  ibuprofen (ADVIL,MOTRIN) 200 MG tablet Take 800 mg by mouth every 6 (six) hours as needed for moderate pain (pain).   Yes Historical Provider, MD  ibuprofen (ADVIL,MOTRIN) 600 MG tablet Take 1 tablet (600 mg total) by mouth every 6 (six) hours as needed for mild pain or moderate pain. 06/24/13  Yes Narda Bonds, MD  lisinopril (PRINIVIL,ZESTRIL) 2.5 MG tablet Take 1 tablet (2.5 mg total) by mouth daily. 07/14/13  Yes Jamal Collin, MD  ferrous sulfate 325 (65 FE) MG tablet Take 1 tablet (325 mg total) by mouth 2 (two) times daily with a meal. Patient not taking: Reported on 04/16/2014 07/14/13   Jamal Collin, MD  methimazole (TAPAZOLE) 10 MG tablet Take 10 mg by mouth 6 (six) times daily. 07/14/13   Jamal Collin, MD  metoprolol (LOPRESSOR) 50 MG tablet Take 2 tablets (100 mg total) by mouth 2 (two) times daily. 07/14/13   Jamal Collin, MD   BP 122/70 mmHg  Pulse 98  Temp(Src) 98.6 F (37 C) (Oral)  Resp 20  SpO2 98% Physical Exam  Constitutional: She is oriented to person, place, and time. She appears well-developed and well-nourished.  HENT:  Head: Normocephalic and atraumatic.  Eyes: Conjunctivae and EOM are normal. Pupils are equal, round, and reactive to light.  Neck: Normal range of motion. Neck supple.  Cardiovascular: Normal rate and regular  rhythm.  Exam reveals no gallop and no friction rub.   No murmur heard. Pulmonary/Chest: Effort normal and breath sounds normal. No respiratory distress. She has no wheezes. She has no rales. She exhibits no tenderness.  Abdominal: Soft. Bowel sounds are normal. She exhibits no distension and no mass. There is no tenderness. There is no rebound and no guarding.  Musculoskeletal: Normal range of motion. She exhibits no edema or tenderness.  No evidence of tendon injury, range of motion strength toes 5/5  Neurological: She is alert and oriented to person,  place, and time.  Sensation and strength of right foot intact  Skin: Skin is warm and dry.  3 cm angular laceration of the right foot anteriorly, no foreign body  Psychiatric: She has a normal mood and affect. Her behavior is normal. Judgment and thought content normal.  Nursing note and vitals reviewed.   ED Course  Procedures (including critical care time) Labs Review  LACERATION REPAIR Performed by: Roxy Horseman and Whitney McVey, PA-S Authorized by: Roxy Horseman Consent: Verbal consent obtained. Risks and benefits: risks, benefits and alternatives were discussed Consent given by: patient Patient identity confirmed: provided demographic data Prepped and Draped in normal sterile fashion Wound explored  Laceration Location: right foot  Laceration Length: 3cm  No Foreign Bodies seen or palpated  Anesthesia: local infiltration  Local anesthetic: lidocaine 2% with epinephrine  Anesthetic total: 5 ml  Irrigation method: syringe Amount of cleaning: standard  Skin closure: 4-0 prolene  Number of sutures: 5  Technique: interrupted  Patient tolerance: Patient tolerated the procedure well with no immediate complications.  Labs Reviewed - No data to display  Imaging Review Dg Foot Complete Right  04/16/2014   CLINICAL DATA:  59 year old female with with injury from glass table with soft tissue lacerations and pain. Initial encounter.  EXAM: RIGHT FOOT COMPLETE - 3+ VIEW  COMPARISON:  None.  FINDINGS: There is no evidence of fracture or dislocation. There is no evidence of arthropathy or other focal bone abnormality. Soft tissue swelling is present.  There is no evidence of radiopaque foreign body.  IMPRESSION: Soft tissue swelling without acute bony abnormality or radiopaque foreign body.   Electronically Signed   By: Harmon Pier M.D.   On: 04/16/2014 18:47     EKG Interpretation None      MDM   Final diagnoses:  Laceration    Patient with right foot  laceration. No foreign body, no immediate evidence of tendon laceration. Plain films are negative. Tdap updated. Laceration repaired in ED. Will recommend orthopedic follow-up to retest strength next week.  8:05 PM Family member approaches me and states that patient has been acting erratically over the last several months, stealing, gambling, and has had severe personality changes. The patient seems very reasonable to me at this time, but family member would like behavioral health evaluation. I discussed this with the patient, who states that she is agreeable. We'll consult TTS.  TTS recommends outpatient follow-up.  Not a candidate for inpatient care.  Will DC with ortho follow-up and post op shoe.   Roxy Horseman, PA-C 04/16/14 2055  Arby Barrette, MD 04/16/14 2107

## 2014-04-16 NOTE — ED Notes (Signed)
Patient transported to X-ray 

## 2014-04-21 ENCOUNTER — Encounter: Payer: Self-pay | Admitting: Family Medicine

## 2014-04-21 ENCOUNTER — Ambulatory Visit (INDEPENDENT_AMBULATORY_CARE_PROVIDER_SITE_OTHER): Payer: PRIVATE HEALTH INSURANCE | Admitting: Family Medicine

## 2014-04-21 VITALS — BP 125/85 | HR 88 | Temp 98.4°F | Ht 64.0 in | Wt 118.0 lb

## 2014-04-21 DIAGNOSIS — S91311A Laceration without foreign body, right foot, initial encounter: Secondary | ICD-10-CM | POA: Diagnosis not present

## 2014-04-21 NOTE — Assessment & Plan Note (Signed)
Reviewed ED notes. Pt reporting improvement in pain and well controlled with ibuprofen. There is still a significant amount of swelling in the foot, and I counseled regarding need to elevate foot and continue icing so that the skin can close properly. Pt requesting to go back to work, states she will not push herself and if she worsens will stop; given note to return to work with breaks every 1-2 hours to elevate the foot. F/u 5 days for suture removal.

## 2014-04-21 NOTE — Patient Instructions (Signed)
The most important thing you need to do is ELEVATE the foot and ice it. The swelling is still present and needs to go away to heal properly.  Follow up on Monday 4/18 for possible suture removal.

## 2014-04-21 NOTE — Progress Notes (Signed)
   Subjective:    Patient ID: Julie Ware, female    DOB: 1955-02-16, 59 y.o.   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  Review of Systems   See HPI for ROS. All other systems reviewed and are negative.  Past medical history, surgical, family, and social history reviewed and updated in the EMR as appropriate. Objective:  BP 125/85 mmHg  Pulse 88  Temp(Src) 98.4 F (36.9 C) (Oral)  Ht 5\' 4"  (1.626 m)  Wt 118 lb (53.524 kg)  BMI 20.24 kg/m2 Vitals and nursing note reviewed  General: NAD MSK: right foot laceration dorsum healing well, no erythema, no drainage. Foot is swollen. Sutures intact. ROM toes intact, sensation normal. PT pulses present bilaterally  Assessment & Plan:  See Problem List Documentation

## 2014-04-28 ENCOUNTER — Ambulatory Visit: Payer: PRIVATE HEALTH INSURANCE | Admitting: Family Medicine

## 2014-10-21 ENCOUNTER — Encounter: Payer: Self-pay | Admitting: Pharmacist

## 2014-11-18 ENCOUNTER — Ambulatory Visit (INDEPENDENT_AMBULATORY_CARE_PROVIDER_SITE_OTHER): Payer: Self-pay | Admitting: Family Medicine

## 2014-11-18 ENCOUNTER — Ambulatory Visit: Payer: PRIVATE HEALTH INSURANCE | Admitting: Student

## 2014-11-18 ENCOUNTER — Encounter: Payer: Self-pay | Admitting: Family Medicine

## 2014-11-18 VITALS — BP 122/75 | HR 88 | Temp 98.0°F | Wt 127.0 lb

## 2014-11-18 DIAGNOSIS — J069 Acute upper respiratory infection, unspecified: Secondary | ICD-10-CM | POA: Insufficient documentation

## 2014-11-18 MED ORDER — BENZONATATE 100 MG PO CAPS: 100.0000 mg | ORAL_CAPSULE | Freq: Three times a day (TID) | ORAL | Status: DC | PRN

## 2014-11-18 NOTE — Assessment & Plan Note (Signed)
Patient is here with complaints of a cough, congestion, and sore throat 2 days. Signs and symptoms most consistent with a viral upper respiratory infection. Patient has only taken some ibuprofen for pain relief. She is been afebrile throughout this course. - Tessalon Perles 100 mg 3 times a day when necessary for cough - Encouraged continued ibuprofen for pain/headache/body aches. (Patient is allergic to acetaminophen) - Encouraged increased fluid intake. - Patient asked for a note excusing her for work tomorrow. Note was provided.

## 2014-11-18 NOTE — Progress Notes (Signed)
COUGH Cough, congestion, ST, body aches  Has been coughing for 2 days. Cough is: dry Sputum production: no Medications tried: nyquil, advil Taking blood pressure medications: metoprolol, lisinopril (been on for one year, no recent dosing change)  Symptoms Runny nose: no Mucous in back of throat: no Throat burning or reflux: yes Wheezing or asthma: yes, no asthma Fever: no Chest Pain: only when coughing (is nothing like the pain she had when hospitalized for CP) Shortness of breath: no Leg swelling: no Hemoptysis: no Weight loss: no  No vomiting, Yes diarrhea. No known sick contacts, but thinks there was someone out from work for being sick recently.   ROS see HPI Smoking Status noted  Objective: BP 122/75 mmHg  Pulse 88  Temp(Src) 98 F (36.7 C) (Oral)  Wt 127 lb (57.607 kg)  SpO2 96% Gen: NAD, alert, cooperative, appeared uncomfortable. HEENT: NCAT, EOMI, MMM, OP erythematous without exudate. No LAD. CV: RRR Resp: CTAB, no wheezes, non-labored Abd: SNTND, BS present, no guarding or organomegaly Ext: No edema, warm, peripheral pulses bilaterally Neuro: Alert and oriented, Speech clear, No gross deficits  Assessment and plan:  Upper respiratory infection Patient is here with complaints of a cough, congestion, and sore throat 2 days. Signs and symptoms most consistent with a viral upper respiratory infection. Patient has only taken some ibuprofen for pain relief. She is been afebrile throughout this course. - Tessalon Perles 100 mg 3 times a day when necessary for cough - Encouraged continued ibuprofen for pain/headache/body aches. (Patient is allergic to acetaminophen) - Encouraged increased fluid intake. - Patient asked for a note excusing her for work tomorrow. Note was provided.   Meds ordered this encounter  Medications  . benzonatate (TESSALON) 100 MG capsule    Sig: Take 1 capsule (100 mg total) by mouth 3 (three) times daily as needed for cough.   Dispense:  30 capsule    Refill:  0     Kathee Delton, MD,MS,  PGY2 11/18/2014 6:02 PM

## 2014-11-18 NOTE — Patient Instructions (Addendum)
Take the [facility], for cough, as needed up to 3 times a day. Take Tylenol as prescribed by the bottle, as needed, for fever, pain, headaches, and/or body aches.  Upper Respiratory Infection, Adult Most upper respiratory infections (URIs) are a viral infection of the air passages leading to the lungs. A URI affects the nose, throat, and upper air passages. The most common type of URI is nasopharyngitis and is typically referred to as "the common cold." URIs run their course and usually go away on their own. Most of the time, a URI does not require medical attention, but sometimes a bacterial infection in the upper airways can follow a viral infection. This is called a secondary infection. Sinus and middle ear infections are common types of secondary upper respiratory infections. Bacterial pneumonia can also complicate a URI. A URI can worsen asthma and chronic obstructive pulmonary disease (COPD). Sometimes, these complications can require emergency medical care and may be life threatening.  CAUSES Almost all URIs are caused by viruses. A virus is a type of germ and can spread from one person to another.  RISKS FACTORS You may be at risk for a URI if:   You smoke.   You have chronic heart or lung disease.  You have a weakened defense (immune) system.   You are very young or very old.   You have nasal allergies or asthma.  You work in crowded or poorly ventilated areas.  You work in health care facilities or schools. SIGNS AND SYMPTOMS  Symptoms typically develop 2-3 days after you come in contact with a cold virus. Most viral URIs last 7-10 days. However, viral URIs from the influenza virus (flu virus) can last 14-18 days and are typically more severe. Symptoms may include:   Runny or stuffy (congested) nose.   Sneezing.   Cough.   Sore throat.   Headache.   Fatigue.   Fever.   Loss of appetite.   Pain in your forehead, behind your eyes, and over your  cheekbones (sinus pain).  Muscle aches.  DIAGNOSIS  Your health care provider may diagnose a URI by:  Physical exam.  Tests to check that your symptoms are not due to another condition such as:  Strep throat.  Sinusitis.  Pneumonia.  Asthma. TREATMENT  A URI goes away on its own with time. It cannot be cured with medicines, but medicines may be prescribed or recommended to relieve symptoms. Medicines may help:  Reduce your fever.  Reduce your cough.  Relieve nasal congestion. HOME CARE INSTRUCTIONS   Take medicines only as directed by your health care provider.   Gargle warm saltwater or take cough drops to comfort your throat as directed by your health care provider.  Use a warm mist humidifier or inhale steam from a shower to increase air moisture. This may make it easier to breathe.  Drink enough fluid to keep your urine clear or pale yellow.   Eat soups and other clear broths and maintain good nutrition.   Rest as needed.   Return to work when your temperature has returned to normal or as your health care provider advises. You may need to stay home longer to avoid infecting others. You can also use a face mask and careful hand washing to prevent spread of the virus.  Increase the usage of your inhaler if you have asthma.   Do not use any tobacco products, including cigarettes, chewing tobacco, or electronic cigarettes. If you need help quitting, ask your  health care provider. PREVENTION  The best way to protect yourself from getting a cold is to practice good hygiene.   Avoid oral or hand contact with people with cold symptoms.   Wash your hands often if contact occurs.  There is no clear evidence that vitamin C, vitamin E, echinacea, or exercise reduces the chance of developing a cold. However, it is always recommended to get plenty of rest, exercise, and practice good nutrition.  SEEK MEDICAL CARE IF:   You are getting worse rather than better.    Your symptoms are not controlled by medicine.   You have chills.  You have worsening shortness of breath.  You have brown or red mucus.  You have yellow or brown nasal discharge.  You have pain in your face, especially when you bend forward.  You have a fever.  You have swollen neck glands.  You have pain while swallowing.  You have white areas in the back of your throat. SEEK IMMEDIATE MEDICAL CARE IF:   You have severe or persistent:  Headache.  Ear pain.  Sinus pain.  Chest pain.  You have chronic lung disease and any of the following:  Wheezing.  Prolonged cough.  Coughing up blood.  A change in your usual mucus.  You have a stiff neck.  You have changes in your:  Vision.  Hearing.  Thinking.  Mood. MAKE SURE YOU:   Understand these instructions.  Will watch your condition.  Will get help right away if you are not doing well or get worse.   This information is not intended to replace advice given to you by your health care provider. Make sure you discuss any questions you have with your health care provider.   Document Released: 06/20/2000 Document Revised: 05/11/2014 Document Reviewed: 04/01/2013 Elsevier Interactive Patient Education [employer]

## 2015-01-25 ENCOUNTER — Telehealth: Payer: Self-pay | Admitting: *Deleted

## 2015-01-25 NOTE — Telephone Encounter (Signed)
Called patient to offer flu vaccine, however, VM picked up on both mobile and home numbers. Left message on patient's voice mail to return call. Fredderick Severance, RN

## 2015-03-01 NOTE — Telephone Encounter (Signed)
Called patient to offer flu vaccine, however, patient's daughter's boyfriend picked up.  No message left. Will attempt to reach patient later. Fredderick Severance, RN

## 2016-03-27 ENCOUNTER — Emergency Department (HOSPITAL_COMMUNITY)
Admission: EM | Admit: 2016-03-27 | Discharge: 2016-03-27 | Disposition: A | Discharge status: Home / Self Care | Payer: BLUE CROSS/BLUE SHIELD | Attending: Emergency Medicine | Admitting: Emergency Medicine

## 2016-03-27 ENCOUNTER — Encounter (HOSPITAL_COMMUNITY): Payer: Self-pay | Admitting: Emergency Medicine

## 2016-03-27 DIAGNOSIS — M546 Pain in thoracic spine: Secondary | ICD-10-CM | POA: Insufficient documentation

## 2016-03-27 DIAGNOSIS — I5022 Chronic systolic (congestive) heart failure: Secondary | ICD-10-CM | POA: Diagnosis not present

## 2016-03-27 DIAGNOSIS — M629 Disorder of muscle, unspecified: Secondary | ICD-10-CM

## 2016-03-27 DIAGNOSIS — M5382 Other specified dorsopathies, cervical region: Secondary | ICD-10-CM

## 2016-03-27 DIAGNOSIS — M542 Cervicalgia: Secondary | ICD-10-CM | POA: Diagnosis not present

## 2016-03-27 MED ORDER — IBUPROFEN 200 MG PO TABS
400.0000 mg | ORAL_TABLET | Freq: Once | ORAL | Status: AC
  Administered 2016-03-27: 400 mg via ORAL
  Filled 2016-03-27: qty 2

## 2016-03-27 MED ORDER — OXYCODONE HCL 5 MG PO TABS: 5.0000 mg | ORAL_TABLET | ORAL | 0 refills | Status: DC | PRN

## 2016-03-27 MED ORDER — METAXALONE 800 MG PO TABS: 800.0000 mg | ORAL_TABLET | Freq: Three times a day (TID) | ORAL | 0 refills | Status: DC

## 2016-03-27 MED ORDER — KETOROLAC TROMETHAMINE 60 MG/2ML IM SOLN
30.0000 mg | Freq: Once | INTRAMUSCULAR | Status: AC
  Administered 2016-03-27: 30 mg via INTRAMUSCULAR
  Filled 2016-03-27: qty 2

## 2016-03-27 NOTE — ED Provider Notes (Signed)
[facility]-EMERGENCY DEPT Provider Note   CSN: [id number] Arrival date & time: 03/27/16  1313     History   Chief Complaint Chief Complaint  Patient presents with  . Back Pain  . Migraine    HPI Julie Ware is a 61 y.o. female.  61 year old female presents with 3 days of upper back and neck pain characterized as sharp and worse with movement. The symptoms have caused the patient to develop a migraine. Her headache has not been associated with vomiting or photophobia. She has used ibuprofen with limited relief. Denies any numbness or tingling in her arms or legs. Pain is better with remaining still but is worse with certain movements of her neck. Denies any recent history of trauma.      Past Medical History:  Diagnosis Date  . Anxiety   . Graves disease 01/2013  . Microcytic anemia 09/2011  . Thrombocytopenia (HCC) 06/2011    Patient Active Problem List   Diagnosis Date Noted  . Upper respiratory infection 11/18/2014  . Laceration of foot, right 04/21/2014  . Chronic systolic CHF (congestive heart failure), NYHA class 1 (HCC) 08/27/2013  . Moderate mitral regurgitation 08/27/2013  . Pulmonary HTN 08/27/2013  . Anemia, iron deficiency 06/29/2013  . Cardiomyopathy, dilated, nonischemic (HCC) 06/21/2013  . Nonspecific (abnormal) findings on radiological and other examination of biliary tract 06/20/2013  . Nonspecific abnormal results of liver function study 06/20/2013  . Chest pain 06/18/2013  . Cardiomegaly 06/18/2013  . Hyperthyroidism 01/30/2013    Past Surgical History:  Procedure Laterality Date  . CESAREAN SECTION     X 3 (1978, 1980, 1992)  . ENDOMETRIAL BIOPSY  2003   benign pathology.   . TUBAL LIGATION  1992    OB History    No data available       Home Medications    Prior to Admission medications   Medication Sig Start Date End Date Taking? Authorizing Provider  ibuprofen (ADVIL,MOTRIN) 200 MG tablet Take 800 mg by mouth every 6 (six) hours as  needed (For headache or pain.).   Yes Historical Provider, MD  tetrahydrozoline 0.05 % ophthalmic solution Place 2 drops into both eyes 3 (three) times daily.   Yes Historical Provider, MD    Family History Family History  Problem Relation Age of Onset  . Diabetes Mother   . Hypertension Mother   . Hyperlipidemia Mother   . Hyperlipidemia Father   . Hypertension Father   . Diabetes Father   . Hypertension Paternal Grandmother   . Diabetes Paternal Grandmother   . Hypertension Maternal Grandfather   . Diabetes Maternal Grandfather     Social History Social History  Substance Use Topics  . Smoking status: Never Smoker  . Smokeless tobacco: Never Used  . Alcohol use No     Allergies   Tylenol [acetaminophen]   Review of Systems Review of Systems  All other systems reviewed and are negative.    Physical Exam Updated Vital Signs BP 126/70 (BP Location: Left Arm)   Pulse (!) 54   Temp 98.7 F (37.1 C) (Oral)   Resp 13   Ht 5\' 3"  (1.6 m)   Wt 68.9 kg   SpO2 100%   BMI 26.93 kg/m   Physical Exam  Constitutional: She is oriented to person, place, and time. She appears well-developed and well-nourished.  Non-toxic appearance. No distress.  HENT:  Head: Normocephalic and atraumatic.  Eyes: Conjunctivae, EOM and lids are normal. Pupils are equal, round, and reactive to  light.  Neck: Normal range of motion. Neck supple. No tracheal deviation present. No thyroid mass present.  Cardiovascular: Normal rate, regular rhythm and normal heart sounds.  Exam reveals no gallop.   No murmur heard. Pulmonary/Chest: Effort normal and breath sounds normal. No stridor. No respiratory distress. She has no decreased breath sounds. She has no wheezes. She has no rhonchi. She has no rales.  Abdominal: Soft. Normal appearance and bowel sounds are normal. She exhibits no distension. There is no tenderness. There is no rebound and no CVA tenderness.  Musculoskeletal: Normal range of motion.  She exhibits no edema or tenderness.       Back:  Neurological: She is alert and oriented to person, place, and time. She has normal strength. No cranial nerve deficit or sensory deficit. GCS eye subscore is 4. GCS verbal subscore is 5. GCS motor subscore is 6.  Skin: Skin is warm and dry. No abrasion and no rash noted.  Psychiatric: She has a normal mood and affect. Her speech is normal and behavior is normal.  Nursing note and vitals reviewed.    ED Treatments / Results  Labs (all labs ordered are listed, but only abnormal results are displayed) Labs Reviewed - No data to display  EKG  EKG Interpretation None       Radiology No results found.  Procedures Procedures (including critical care time)  Medications Ordered in ED Medications  ketorolac (TORADOL) injection 30 mg (not administered)  ibuprofen (ADVIL,MOTRIN) tablet 400 mg (400 mg Oral Given 03/27/16 1525)     Initial Impression / Assessment and Plan / ED Course  I have reviewed the triage vital signs and the nursing notes.  Pertinent labs & imaging results that were available during my care of the patient were reviewed by me and considered in my medical decision making (see chart for details).     Patient medicated with Toradol here. She denies any dyspnea. No chest pain. She is pinpoint tender along her trapezius muscle. She was medicated with Toradol here after patient informed me that she will be driving home. Doesn't have arrived therefore cannot receive anything stronger than Toradol. Will give prescription for muscle relaxants and analgesics and return precautions  Final Clinical Impressions(s) / ED Diagnoses   Final diagnoses:  None    New Prescriptions New Prescriptions   No medications on file     Lorre Nick, MD 03/27/16 2258

## 2016-03-27 NOTE — ED Triage Notes (Signed)
Pt reports pain to back with inspiration.  Also c/o migraine today w/hx of the same.

## 2016-03-27 NOTE — ED Triage Notes (Signed)
Pt complaint of upper back pain for 3 days; denies injury; denies GU symptoms.

## 2016-06-07 ENCOUNTER — Ambulatory Visit: Payer: BLUE CROSS/BLUE SHIELD | Admitting: Family Medicine

## 2017-11-13 ENCOUNTER — Ambulatory Visit: Payer: BLUE CROSS/BLUE SHIELD | Admitting: Family Medicine

## 2017-12-11 ENCOUNTER — Ambulatory Visit: Payer: Self-pay | Admitting: Family Medicine

## 2018-03-04 ENCOUNTER — Ambulatory Visit: Payer: Self-pay | Admitting: Family Medicine

## 2018-03-19 ENCOUNTER — Other Ambulatory Visit: Payer: Self-pay

## 2018-03-19 ENCOUNTER — Ambulatory Visit (INDEPENDENT_AMBULATORY_CARE_PROVIDER_SITE_OTHER): Payer: 59 | Admitting: Family Medicine

## 2018-03-19 ENCOUNTER — Encounter: Payer: Self-pay | Admitting: Family Medicine

## 2018-03-19 VITALS — BP 126/68 | HR 74 | Temp 98.9°F | Wt 167.6 lb

## 2018-03-19 DIAGNOSIS — Z1239 Encounter for other screening for malignant neoplasm of breast: Secondary | ICD-10-CM

## 2018-03-19 DIAGNOSIS — I340 Nonrheumatic mitral (valve) insufficiency: Secondary | ICD-10-CM | POA: Diagnosis not present

## 2018-03-19 DIAGNOSIS — R7303 Prediabetes: Secondary | ICD-10-CM

## 2018-03-19 DIAGNOSIS — Z0000 Encounter for general adult medical examination without abnormal findings: Secondary | ICD-10-CM | POA: Diagnosis not present

## 2018-03-19 DIAGNOSIS — E0500 Thyrotoxicosis with diffuse goiter without thyrotoxic crisis or storm: Secondary | ICD-10-CM | POA: Insufficient documentation

## 2018-03-19 LAB — POCT GLYCOSYLATED HEMOGLOBIN (HGB A1C): Hemoglobin A1C: 6.1 % — AB (ref 4.0–5.6)

## 2018-03-19 NOTE — Assessment & Plan Note (Signed)
Chronic.  Self discontinued methimazole.  Suspicious for hypothyroidism given significant weight gain over the last several years. - Checking TSH, free T4, T3

## 2018-03-19 NOTE — Assessment & Plan Note (Signed)
History of diagnosis during uncontrolled Graves' disease crisis.  No signs of fluid overload or symptomatic HF. - We will continue monitoring - See plan for Graves' disease

## 2018-03-19 NOTE — Assessment & Plan Note (Addendum)
Chronic.  Controlled, A1c 6.1.  Not currently on medications. - Checking CBC, CMAC, TSH, lipid panel, microalbumin - Discussed importance of lifestyle modifications

## 2018-03-19 NOTE — Patient Instructions (Signed)
Thank you for coming in to see [place] today. Please see below to review our plan for today's visit.  1.  Please call the numbers provided to schedule the mammogram and colonoscopy. 2.  We will call you if there is any abnormalities in your labs within 48 hours, otherwise you should receive results in the mail. 3.  Please follow-up with me in 1 month to discuss follow-up.  Please call the clinic at [phone] if your symptoms worsen or you have any concerns. It was our pleasure to serve you.  Durward Parcel, DO [facility], PGY-3

## 2018-03-19 NOTE — Progress Notes (Signed)
Subjective   Patient ID: Julie Ware    DOB: [birth year], 63 y.o. female   MRN: [id number]  CC: "Establish care"  HPI: Julie Ware is a 63 y.o. female who presents to clinic today for the following:  Establish care: Julie Ware presents today to reestablish care.  She has not been seen since 2016.  Her primary issues include thyroid disease and mitral regurgitation with heart failure.  She is currently not on medications.  She is a non-smoker.  She denies alcohol or illicit drug use.  Recently been experiencing a nonproductive cough without fever.  She denies any recent travel.  Graves' disease: Diagnosed back in 2015 after a 105 pound weight loss.  She was previously on methimazole but self discontinued medication approximately 2 years ago.  She has not had any follow-up since.  She does have a history of chest pain which has been evaluated in the ED which was attributed to noncardiac causes.  She is currently without chest pain or shortness of breath but does report a gradual increase in weight over the last several years.  Mitral regurg and heart failure: Diagnosed with HFpEF of 45-50% back in 2015 during episode of uncontrolled Graves' disease.  She was previously on Lopressor but self discontinued medication along with her thyroid medication.  She denies any chest pain, shortness of breath, lower extremity swelling, syncope or fatigue.  ROS: see HPI for pertinent.  PMFSH: Mitral regurg, NICM, pulmonary HTN, IDA, Graves' disease.  Surgical history C-sec, tubal.  Family history DM, HTN, HLD.  Smoking status reviewed. Medications reviewed.  Objective   BP 126/68   Pulse 74   Temp 98.9 F (37.2 C) (Oral)   Wt 167 lb 9.6 oz (76 kg)   SpO2 100%   BMI 29.69 kg/m  Vitals and nursing note reviewed.  General: well nourished, well developed, NAD with non-toxic appearance HEENT: normocephalic, atraumatic, moist mucous membranes, absent proptosis Neck: supple, non-tender without  lymphadenopathy Cardiovascular: regular rate and rhythm without murmurs, rubs, or gallops Lungs: clear to auscultation bilaterally with normal work of breathing Abdomen: soft, non-tender, non-distended, normoactive bowel sounds Skin: warm, dry, no rashes or lesions, cap refill < 2 seconds Extremities: warm and well perfused, normal tone, no edema  Assessment & Plan   Moderate mitral regurgitation History of diagnosis during uncontrolled Graves' disease crisis.  No signs of fluid overload or symptomatic HF. - We will continue monitoring - See plan for Graves' disease  Prediabetes Chronic.  Controlled, A1c 6.1.  Not currently on medications. - Checking CBC, CMAC, TSH, lipid panel, microalbumin - Discussed importance of lifestyle modifications  Graves disease Chronic.  Self discontinued methimazole.  Suspicious for hypothyroidism given significant weight gain over the last several years. - Checking TSH, free T4, T3  Orders Placed This Encounter  Procedures  . MM Digital Screening    Standing Status:   Future    Standing Expiration Date:   03/19/2019    Order Specific Question:   Reason for Exam (SYMPTOM  OR DIAGNOSIS REQUIRED)    Answer:   screening    Order Specific Question:   Is the patient pregnant?    Answer:   No    Order Specific Question:   Preferred imaging location?    Answer:   [facility]  . CBC  . Comprehensive metabolic panel    Order Specific Question:   Has the patient fasted?    Answer:   No  . TSH  . T4, Free  .  T3  . Lipid Panel    Order Specific Question:   Has the patient fasted?    Answer:   No  . Microalbumin/Creatinine Ratio, Urine  . HgB A1c   No orders of the defined types were placed in this encounter.   Durward Parcel, DO [facility], PGY-3 03/19/2018, 4:50 PM

## 2018-03-20 LAB — MICROALBUMIN / CREATININE URINE RATIO
Creatinine, Urine: 138.3 mg/dL
MICROALB/CREAT RATIO: 54 mg/g{creat} — AB (ref 0–29)
Microalbumin, Urine: 74.3 ug/mL

## 2018-03-20 LAB — T4, FREE: FREE T4: 1.27 ng/dL (ref 0.82–1.77)

## 2018-03-20 LAB — COMPREHENSIVE METABOLIC PANEL
ALBUMIN: 4.2 g/dL (ref 3.8–4.9)
ALK PHOS: 112 IU/L (ref 39–117)
ALT: 11 IU/L (ref 0–32)
AST: 16 IU/L (ref 0–40)
Albumin/Globulin Ratio: 1.3 (ref 1.2–2.2)
BUN / CREAT RATIO: 16 (ref 12–28)
BUN: 9 mg/dL (ref 8–27)
Bilirubin Total: 0.2 mg/dL (ref 0.0–1.2)
CHLORIDE: 106 mmol/L (ref 96–106)
CO2: 24 mmol/L (ref 20–29)
Calcium: 9.1 mg/dL (ref 8.7–10.3)
Creatinine, Ser: 0.56 mg/dL — ABNORMAL LOW (ref 0.57–1.00)
GFR calc Af Amer: 117 mL/min/{1.73_m2} (ref >59)
GFR calc non Af Amer: 102 mL/min/{1.73_m2} (ref >59)
GLUCOSE: 89 mg/dL (ref 65–99)
Globulin, Total: 3.2 g/dL (ref 1.5–4.5)
Potassium: 4 mmol/L (ref 3.5–5.2)
Sodium: 142 mmol/L (ref 134–144)
Total Protein: 7.4 g/dL (ref 6.0–8.5)

## 2018-03-20 LAB — LIPID PANEL
CHOLESTEROL TOTAL: 195 mg/dL (ref 100–199)
Chol/HDL Ratio: 3 ratio (ref 0.0–4.4)
HDL: 66 mg/dL (ref >39)
LDL CALC: 113 mg/dL — AB (ref 0–99)
TRIGLYCERIDES: 78 mg/dL (ref 0–149)
VLDL Cholesterol Cal: 16 mg/dL (ref 5–40)

## 2018-03-20 LAB — T3: T3, Total: 163 ng/dL (ref 71–180)

## 2018-03-20 LAB — CBC
HEMOGLOBIN: 13.6 g/dL (ref 11.1–15.9)
Hematocrit: 39.9 % (ref 34.0–46.6)
MCH: 26.8 pg (ref 26.6–33.0)
MCHC: 34.1 g/dL (ref 31.5–35.7)
MCV: 79 fL (ref 79–97)
PLATELETS: 215 10*3/uL (ref 150–450)
RBC: 5.07 x10E6/uL (ref 3.77–5.28)
RDW: 14.3 % (ref 11.7–15.4)
WBC: 4.8 10*3/uL (ref 3.4–10.8)

## 2018-03-20 LAB — TSH: TSH: 1.34 u[IU]/mL (ref 0.450–4.500)

## 2018-03-21 ENCOUNTER — Encounter: Payer: Self-pay | Admitting: Family Medicine

## 2018-04-22 ENCOUNTER — Telehealth (INDEPENDENT_AMBULATORY_CARE_PROVIDER_SITE_OTHER): Payer: 59 | Admitting: Family Medicine

## 2018-04-22 ENCOUNTER — Other Ambulatory Visit: Payer: Self-pay

## 2018-04-22 DIAGNOSIS — J189 Pneumonia, unspecified organism: Secondary | ICD-10-CM

## 2018-04-22 NOTE — Progress Notes (Signed)
[facility] Telemedicine Visit  Patient consented to have virtual visit. Method of visit: Telephone  Encounter participants: Patient: Julie Ware - located at home Provider: Garnette Gunner - located at office Others (if applicable):   Chief Complaint:  Follow up after PNA  HPI:  Seen in [facility] 03/36/20. Found to have pneumonia by chest xray. RVP and SARS-CoV2 was negative. Treated doxycycline. Patient reports resolution of symptoms. No fevers or chills. Patient has had to take [facility] for pain.   ROS: per HPI  Pertinent PMHx: CHF  Exam:  Respiratory: speaking in full sentences, no respiratory distress  Assessment/Plan: Pt needs note stating that she has recovered. Based on my phone assessment and the time course of illness. Patient is non-infectious and can return to work safely. Will write a note.  Time spent during visit with patient: 10  minutes

## 2018-04-23 ENCOUNTER — Ambulatory Visit: Payer: 59

## 2018-04-23 ENCOUNTER — Ambulatory Visit: Payer: 59 | Admitting: Family Medicine

## 2018-06-12 ENCOUNTER — Ambulatory Visit: Payer: 59

## 2018-09-03 ENCOUNTER — Telehealth (INDEPENDENT_AMBULATORY_CARE_PROVIDER_SITE_OTHER): Payer: 59 | Admitting: Family Medicine

## 2018-09-03 ENCOUNTER — Other Ambulatory Visit: Payer: Self-pay

## 2018-09-03 ENCOUNTER — Other Ambulatory Visit: Payer: Self-pay | Admitting: *Deleted

## 2018-09-03 DIAGNOSIS — R05 Cough: Secondary | ICD-10-CM

## 2018-09-03 DIAGNOSIS — R059 Cough, unspecified: Secondary | ICD-10-CM

## 2018-09-03 DIAGNOSIS — J988 Other specified respiratory disorders: Secondary | ICD-10-CM | POA: Insufficient documentation

## 2018-09-03 DIAGNOSIS — J09X2 Influenza due to identified novel influenza A virus with other respiratory manifestations: Secondary | ICD-10-CM

## 2018-09-03 NOTE — Progress Notes (Signed)
[place] Telemedicine Visit  Patient consented to have virtual visit. Method of visit: Telephone  Encounter participants: Patient: Julie Ware - located at home Provider: Benay Pike - located at [facility] Others (if applicable): none  Chief Complaint: Sore throat, cough, body aches  HPI:  A few days of sore throat, coughing, body aches.  The first day she had a fever 100 but not since then.  She has been taking alleve for the body aches yesterday.  She was okay Sunday until Sunday evening when she had a bad headache and felt body aches. No abdominal pain/diarreha/vomiting.  She has a dry cough. She is taking cough drops for her sore throat.  She has not been around anyone that has been sick. No anosmia.   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        ROS: per HPI  Pertinent PMHx: pneumonia in April   Exam:  Respiratory: speaking in full sentences without difficulty.  No cough.   Assessment/Plan:  Respiratory infection Patient complaining of 3 days of dry cough, sore throat, body aches, headache.  1 reported temperature of 100.0, but has been afebrile since then.  Advised patient to take no more than 2 Aleve per day.  Wrote an order for coronavirus lab testing.  Told patient she can go to the [facility] on [address] and get tested today. Advised her to Isolate until she gets a negative result or 10 days from first day of symptoms.     Time spent during visit with patient: 12 minutes

## 2018-09-03 NOTE — Assessment & Plan Note (Signed)
Patient complaining of 3 days of dry cough, sore throat, body aches, headache.  1 reported temperature of 100.0, but has been afebrile since then.  Advised patient to take no more than 2 Aleve per day.  Wrote an order for coronavirus lab testing.  Told patient she can go to the [facility] on [address] and get tested today. Advised her to Isolate until she gets a negative result or 10 days from first day of symptoms.

## 2018-09-04 LAB — NOVEL CORONAVIRUS, NAA: SARS-CoV-2, NAA: NOT DETECTED

## 2018-09-04 LAB — SPECIMEN STATUS REPORT

## 2019-04-11 ENCOUNTER — Ambulatory Visit: Payer: Self-pay | Attending: Internal Medicine

## 2019-04-11 DIAGNOSIS — Z23 Encounter for immunization: Secondary | ICD-10-CM

## 2019-04-11 NOTE — Progress Notes (Signed)
   Covid-19 Vaccination Clinic  Name:  Julie Ware    MRN: [id number] DOB: 12-24-1955  04/11/2019  [name] was observed post Covid-19 immunization for 15 minutes without incident. She was provided with Vaccine Information Sheet and instruction to access the V-Safe system.   [name] was instructed to call 911 with any severe reactions post vaccine: Marland Kitchen Difficulty breathing  . Swelling of face and throat  . A fast heartbeat  . A bad rash all over body  . Dizziness and weakness   Immunizations Administered    Name Date Dose VIS Date Route   Moderna COVID-19 Vaccine 04/11/2019 12:00 PM 0.5 mL 12/09/2018 Intramuscular   Manufacturer: Moderna   Lot: [id number]   NDC: 32023-343-56

## 2019-05-09 ENCOUNTER — Ambulatory Visit: Payer: Self-pay

## 2019-12-15 ENCOUNTER — Ambulatory Visit: Payer: 59

## 2019-12-21 ENCOUNTER — Ambulatory Visit: Payer: Self-pay

## 2020-12-15 NOTE — Progress Notes (Signed)
    SUBJECTIVE:   CHIEF COMPLAINT / HPI:   COVID-19: Patient presenting for "cold-like symptoms" after testing positive for COVID-19 about 4 days prior.  She states her symptoms started about a week and a half ago. She is having cough and congestion, chills. She initially had fever but not since. She went to [facility] and received a covid test on Monday which was positive. She is also having loss of taste. She initially had some diarrhea which has improved. She feels fatigued. No shortness of breath or chest pain.   PERTINENT  PMH / PSH: HX of CHF  OBJECTIVE:   BP (!) 162/96   Pulse 75   Wt 177 lb 12.8 oz (80.6 kg)   SpO2 100%   BMI 31.50 kg/m    General: NAD, pleasant, able to participate in exam Cardiac: RRR, no murmurs. Respiratory: CTAB, normal effort, No wheezes, rales or rhonchi Lower extremity: Trace lower extremity edema Skin: warm and dry, no rashes noted  ASSESSMENT/PLAN:   COVID-1: 65 year old female who tested positive for COVID-19 earlier this week.  Current O2 sat of 100%.  She is not experiencing any respiratory distress.  Physical exam with lungs clear to auscultation.  She is outside the window for any treatments at this time.  Discussed return precautions in detail.  Recommended isolation per CDC recommendations.  Jackelyn Poling, DO [facility]

## 2020-12-16 ENCOUNTER — Other Ambulatory Visit: Payer: Self-pay

## 2020-12-16 ENCOUNTER — Ambulatory Visit (INDEPENDENT_AMBULATORY_CARE_PROVIDER_SITE_OTHER): Payer: Self-pay | Admitting: Family Medicine

## 2020-12-16 VITALS — BP 162/96 | HR 75 | Wt 177.8 lb

## 2020-12-16 DIAGNOSIS — U071 COVID-19: Secondary | ICD-10-CM

## 2020-12-16 NOTE — Patient Instructions (Signed)
If you develop any trouble breathing, crushing chest pains, vomiting to the extent he cannot keep down fluids, or feelings of confusion you should go to the emergency department.  I anticipate that you will continue to feel better over the next week or 2.  Per CDC recommendations you can return to work on Monday if you wear a mask but you should wear a mask for at least the next 5 days.  If you experience any fevers you should stay out of work until you are at least 24 hours fever free.  Let us know if he has any further questions.

## 2021-02-23 ENCOUNTER — Other Ambulatory Visit (HOSPITAL_COMMUNITY)
Admission: RE | Admit: 2021-02-23 | Discharge: 2021-02-23 | Disposition: A | Discharge status: Home / Self Care | Payer: Self-pay | Source: Ambulatory Visit | Attending: Family Medicine | Admitting: Family Medicine

## 2021-02-23 ENCOUNTER — Ambulatory Visit (INDEPENDENT_AMBULATORY_CARE_PROVIDER_SITE_OTHER): Payer: Self-pay | Admitting: Family Medicine

## 2021-02-23 ENCOUNTER — Other Ambulatory Visit: Payer: Self-pay

## 2021-02-23 VITALS — BP 155/75 | HR 63 | Wt 174.2 lb

## 2021-02-23 DIAGNOSIS — Z124 Encounter for screening for malignant neoplasm of cervix: Secondary | ICD-10-CM

## 2021-02-23 DIAGNOSIS — N898 Other specified noninflammatory disorders of vagina: Secondary | ICD-10-CM

## 2021-02-23 DIAGNOSIS — R030 Elevated blood-pressure reading, without diagnosis of hypertension: Secondary | ICD-10-CM

## 2021-02-23 DIAGNOSIS — R300 Dysuria: Secondary | ICD-10-CM

## 2021-02-23 LAB — POCT URINALYSIS DIP (MANUAL ENTRY)
Bilirubin, UA: NEGATIVE
Glucose, UA: NEGATIVE mg/dL
Ketones, POC UA: NEGATIVE mg/dL
Nitrite, UA: POSITIVE — AB
Protein Ur, POC: 100 mg/dL — AB
Spec Grav, UA: 1.025 (ref 1.010–1.025)
Urobilinogen, UA: 0.2 E.U./dL
pH, UA: 5.5 (ref 5.0–8.0)

## 2021-02-23 LAB — POCT WET PREP (WET MOUNT)
Clue Cells Wet Prep Whiff POC: POSITIVE
Trichomonas Wet Prep HPF POC: ABSENT

## 2021-02-23 MED ORDER — CEPHALEXIN 500 MG PO CAPS: 500.0000 mg | ORAL_CAPSULE | Freq: Two times a day (BID) | ORAL | 0 refills | Status: AC

## 2021-02-23 NOTE — Progress Notes (Signed)
° ° °  SUBJECTIVE:   CHIEF COMPLAINT / HPI:   Dysuria Experiencing several days of suprapubic discomfort, abnormal vaginal odor, and blood when wiping. She has tried over the counter treatments such as AZO but without relief. She denies fever, back pain, emesis. Admits to a history of frequent UTIs that usually resolve with over the counter treatment. Needs a pap smear, declines STD testing.   Elevated BP - Medications: n/a - Denies any SOB, CP, vision changes.   PERTINENT  PMH / PSH: Dilated Cardiomyopathy, systolic CHF  OBJECTIVE:   BP (!) 155/75    Pulse 63    Wt 174 lb 3.2 oz (79 kg)    SpO2 100%    BMI 30.86 kg/m   Urinalysis    Component Value Date/Time   COLORURINE YELLOW 01/18/2013 0841   APPEARANCEUR CLEAR 01/18/2013 0841   LABSPEC 1.014 01/18/2013 0841   PHURINE 5.0 01/18/2013 0841   GLUCOSEU NEGATIVE 01/18/2013 0841   HGBUR NEGATIVE 01/18/2013 0841   BILIRUBINUR negative 02/23/2021 1148   KETONESUR negative 02/23/2021 1148   KETONESUR NEGATIVE 01/18/2013 0841   PROTEINUR =100 (A) 02/23/2021 1148   PROTEINUR NEGATIVE 01/18/2013 0841   UROBILINOGEN 0.2 02/23/2021 1148   UROBILINOGEN 0.2 01/18/2013 0841   NITRITE Positive (A) 02/23/2021 1148   NITRITE NEGATIVE 01/18/2013 0841   LEUKOCYTESUR Large (3+) (A) 02/23/2021 1148    Physical Exam Vitals reviewed. Exam conducted with a chaperone present.  Constitutional:      General: She is not in acute distress.    Appearance: She is not ill-appearing, toxic-appearing or diaphoretic.  Cardiovascular:     Heart sounds: Normal heart sounds.  Pulmonary:     Effort: Pulmonary effort is normal.     Breath sounds: Normal breath sounds.  Abdominal:     Tenderness: There is abdominal tenderness in the suprapubic area. There is no right CVA tenderness or left CVA tenderness.  Genitourinary:    Exam position: Lithotomy position.     Vagina: Normal.     Cervix: Normal.     Uterus: Normal.      Adnexa: Right adnexa normal  and left adnexa normal.  Neurological:     Mental Status: She is alert and oriented to person, place, and time.  Psychiatric:        Mood and Affect: Mood normal.        Behavior: Behavior normal.   ASSESSMENT/PLAN:   1. Dysuria Several days of dysuria, suprapubic tenderness. UA suggest UTI, will treat and follow up cx.  - POCT urinalysis dipstick - cephALEXin (KEFLEX) 500 MG capsule; Take 1 capsule (500 mg total) by mouth 2 (two) times daily for 5 days.  Dispense: 10 capsule; Refill: 0 - Urine Culture  2. Vaginal odor Declined STD testing. No evidence of abnormal vaginal discharge or blood. F/u wet prep and treat appropriately.  - POCT Wet Prep [employer])  3. Screening for cervical cancer - Follow up Cytology - PAP([place])  4. Elevated blood pressure reading Asymptomatic. Reviewed prior visit readings. Elevated to 162/96 12/16/2020. Prior to that normotensive for several years. Cardiology visit from 2015 reveal she was previously taking lisinopril and metoprolol and was supposed to follow up in 3 months. It does not appear she has seen the cardiology since then.  -Follow up in 1 week for BP check -Clarify medications and consider the need to start an antihypertensive if appropriate -Needs cardiology follow up  Lavonda Jumbo, DO [facility]

## 2021-02-23 NOTE — Patient Instructions (Addendum)
Thank you for coming in today. I am going to treat you for a urinary tract infection the medication that I am starting on should cover it.  However I am sending it to be cultured if it grows a different bacteria that is not covered by this medication I will let you know. When your results are available for your pelvic swabs I will notify you. Follow up in 1 week for BP recheck.   Dr. Janus Molder

## 2021-02-27 ENCOUNTER — Telehealth: Payer: Self-pay | Admitting: Family Medicine

## 2021-02-27 MED ORDER — METRONIDAZOLE 500 MG PO TABS: 500.0000 mg | ORAL_TABLET | Freq: Two times a day (BID) | ORAL | 0 refills | Status: AC

## 2021-02-27 NOTE — Telephone Encounter (Signed)
Attempted to call patient about BV results. Received busy tone. Unable to LVM. Will communicate via Mychart. Flagyl entered in this encounter.   Lavonda Jumbo, DO 02/27/2021, 3:34 PM PGY-3, [facility]

## 2021-02-28 LAB — URINE CULTURE

## 2021-02-28 LAB — CYTOLOGY - PAP
Adequacy: ABSENT
Comment: NEGATIVE
Comment: NEGATIVE
Diagnosis: UNDETERMINED — AB
HPV 16: POSITIVE — AB
HPV 18 / 45: NEGATIVE
High risk HPV: POSITIVE — AB

## 2021-03-01 ENCOUNTER — Telehealth: Payer: Self-pay | Admitting: Family Medicine

## 2021-03-01 NOTE — Telephone Encounter (Signed)
Attempted to call patient to discuss results. Busy dial tone. Will communicate via Mychart. Needs to scheduled colposcopy.   Julie Vassel Autry-Lott, DO 03/01/2021, 11:27 AM PGY-3, [facility]

## 2021-06-13 ENCOUNTER — Encounter: Payer: Self-pay | Admitting: *Deleted

## 2021-10-25 ENCOUNTER — Ambulatory Visit (INDEPENDENT_AMBULATORY_CARE_PROVIDER_SITE_OTHER): Payer: Self-pay | Admitting: Family Medicine

## 2021-10-25 ENCOUNTER — Other Ambulatory Visit: Payer: Self-pay

## 2021-10-25 ENCOUNTER — Encounter: Payer: Self-pay | Admitting: Family Medicine

## 2021-10-25 VITALS — BP 115/71 | HR 60 | Ht 63.0 in | Wt 179.4 lb

## 2021-10-25 DIAGNOSIS — E0500 Thyrotoxicosis with diffuse goiter without thyrotoxic crisis or storm: Secondary | ICD-10-CM

## 2021-10-25 DIAGNOSIS — R079 Chest pain, unspecified: Secondary | ICD-10-CM

## 2021-10-25 DIAGNOSIS — E0590 Thyrotoxicosis, unspecified without thyrotoxic crisis or storm: Secondary | ICD-10-CM

## 2021-10-25 DIAGNOSIS — I420 Dilated cardiomyopathy: Secondary | ICD-10-CM

## 2021-10-25 DIAGNOSIS — I340 Nonrheumatic mitral (valve) insufficiency: Secondary | ICD-10-CM

## 2021-10-25 DIAGNOSIS — R42 Dizziness and giddiness: Secondary | ICD-10-CM

## 2021-10-25 NOTE — Patient Instructions (Signed)
I have placed a referral for cardiology.  You should hear from them within the next 1 week.  If you do not, please call my office and let them know and we will check on that.  I will call you if some of the labs that were drawn today come back Normal.  Otherwise I will send you a note in the mail.  3  I go schedule appointment for you with your regular doctor here in about 3 weeks.  I am also giving you a letter to decrease your work commitment to 3 days a week.  Is great to meet you!  If you have any questions please call my office.

## 2021-10-25 NOTE — Assessment & Plan Note (Signed)
We will check complete thyroid panel.

## 2021-10-25 NOTE — Progress Notes (Signed)
    CHIEF COMPLAINT / HPI: Fatigue, dizziness, chest pain.  Was seen recently at emergency department/urgent care and was empirically diagnosed with pneumonia based on minimal chest x-ray findings.  She completed 5 days of doxycycline with no improvement.  Dizziness occurs after she has been standing for a while and she will feel like she is going to "fall out".  She also has dizziness associated with chest pains which she has approximately 2 or 3 times a day.  These are pressure-like, substernal, sometimes radiating into the arm.  Energy level has been low.  She is retired but continues to work several days a week.   PERTINENT  PMH / PSH: I have reviewed the patient's medications, allergies, past medical and surgical history, smoking status and updated in the EMR as appropriate. History of Graves' disease and history of hyperthyroidism History of cardiomyopathy and mild mitral valve regurgitation.  OBJECTIVE:  BP 115/71   Pulse 60   Ht 5\' 3"  (1.6 m)   Wt 179 lb 6.4 oz (81.4 kg)   SpO2 100%   BMI 31.78 kg/m  GENERAL: Well-developed female no acute distress CV: Regular rate and rhythm, loud S1.  No murmur. LUNGS: Clear to auscultation bilaterally without rales or wheeze. ABDOMEN: Soft EXTREMITY: No edema PSYCH: AxOx4. Good eye contact.. No psychomotor retardation or agitation. Appropriate speech fluency and content. Asks and answers questions appropriately. Mood is congruent.   ASSESSMENT / PLAN:   Chest pain Given the increased frequency of thispressure-like  and the associated symptoms of dizziness/lightheadedness, pressure-like pain radiating into the arm, I will send her for cardiology consult.  Additionally she has listed in her problems cardiomyopathy myopathy and mild mitral regurgitation so at the very minimum I think they would probably do an echocardiogram.  Graves disease We will check complete thyroid panel.   Dorcas Mcmurray MD

## 2021-10-25 NOTE — Assessment & Plan Note (Signed)
Given the increased frequency of thispressure-like  and the associated symptoms of dizziness/lightheadedness, pressure-like pain radiating into the arm, I will send her for cardiology consult.  Additionally she has listed in her problems cardiomyopathy myopathy and mild mitral regurgitation so at the very minimum I think they would probably do an echocardiogram.

## 2021-10-26 LAB — CBC
Hematocrit: 41.8 % (ref 34.0–46.6)
Hemoglobin: 13.8 g/dL (ref 11.1–15.9)
MCH: 26.4 pg — ABNORMAL LOW (ref 26.6–33.0)
MCHC: 33 g/dL (ref 31.5–35.7)
MCV: 80 fL (ref 79–97)
Platelets: 198 10*3/uL (ref 150–450)
RBC: 5.23 x10E6/uL (ref 3.77–5.28)
RDW: 14.5 % (ref 11.7–15.4)
WBC: 5.4 10*3/uL (ref 3.4–10.8)

## 2021-10-26 LAB — THYROID PANEL WITH TSH
Free Thyroxine Index: 1.6 (ref 1.2–4.9)
T3 Uptake Ratio: 23 % — ABNORMAL LOW (ref 24–39)
T4, Total: 7.1 ug/dL (ref 4.5–12.0)
TSH: 0.534 u[IU]/mL (ref 0.450–4.500)

## 2021-11-02 ENCOUNTER — Encounter: Payer: Self-pay | Admitting: Family Medicine

## 2021-11-23 ENCOUNTER — Ambulatory Visit: Payer: Self-pay | Admitting: Family Medicine

## 2021-12-14 ENCOUNTER — Ambulatory Visit: Payer: Self-pay | Admitting: Family Medicine

## 2022-06-01 ENCOUNTER — Ambulatory Visit: Payer: Self-pay

## 2022-06-01 NOTE — Progress Notes (Deleted)
    SUBJECTIVE:   CHIEF COMPLAINT / HPI:   Dysuria- {YES/NO AS:20300} Frequency- {YES/NO AS:20300} Urgency- {YES/NO AS:20300} Hematuria- {YES/NO AS:20300} Flank pain- {YES/NO AS:20300} Suprapubic pain- {YES/NO AS:20300} Fevers- {YES/NO AS:20300}   PERTINENT  PMH / PSH: ***  OBJECTIVE:   There were no vitals taken for this visit.  ***  ASSESSMENT/PLAN:   No problem-specific Assessment & Plan notes found for this encounter.     Jo-Ann Johanning, MD [facility]  

## 2022-09-12 ENCOUNTER — Ambulatory Visit: Payer: Self-pay | Admitting: Family Medicine

## 2022-11-14 ENCOUNTER — Ambulatory Visit (INDEPENDENT_AMBULATORY_CARE_PROVIDER_SITE_OTHER): Payer: Self-pay | Admitting: Family Medicine

## 2022-11-14 ENCOUNTER — Encounter: Payer: Self-pay | Admitting: Family Medicine

## 2022-11-14 VITALS — BP 117/80 | HR 68 | Ht 64.0 in | Wt 185.4 lb

## 2022-11-14 DIAGNOSIS — R051 Acute cough: Secondary | ICD-10-CM

## 2022-11-14 DIAGNOSIS — R059 Cough, unspecified: Secondary | ICD-10-CM | POA: Insufficient documentation

## 2022-11-14 DIAGNOSIS — M549 Dorsalgia, unspecified: Secondary | ICD-10-CM

## 2022-11-14 MED ORDER — CETIRIZINE HCL 10 MG PO TABS: 10.0000 mg | ORAL_TABLET | Freq: Every day | ORAL | 0 refills | Status: AC

## 2022-11-14 MED ORDER — BENZONATATE 100 MG PO CAPS: 100.0000 mg | ORAL_CAPSULE | Freq: Two times a day (BID) | ORAL | 0 refills | Status: AC | PRN

## 2022-11-14 NOTE — Progress Notes (Signed)
SUBJECTIVE:   CHIEF COMPLAINT / HPI:  Patient presents with c/o L sided pain after fall. Workman's comp told her not to come here but she came anyway.  Patient had a fall earlier this year and was being seen by [facility] for this.  She reports she went to go see her orthopedic doctor earlier this week who told her that she needed some more imaging and also prescribed prednisone and meloxicam.  She states that she has been dealing with this problem since her fall earlier this year and she is frustrated over the lack of care she has received.  She is tearful on exam today and states she has never been unhealthy in her life before and is really upset with how much pain she is having.  She reports she has left-sided pain in her face arms and legs that comes and goes and is shooting in nature.  She has tried steroid burst before and says this helps a bit.  She has no red flag symptoms to cause concern for cauda equina.   Patient also has c/o of cough.  She had a bout of bronchitis earlier this year and reports this feels similar to that.  She does not have any shortness of breath or fever.  She has some productive cough and coughing fits.  She was given an inhaler when she went to the hospital for similar presentation and says it did not really help her.  She does not smoke or self but is around smokers.    PERTINENT  PMH / PSH:  - Graves disease - IDA - Pulmonary HTN - moderate mitral regurg   OBJECTIVE:   BP 117/80   Pulse 68   Ht 5\' 4"  (1.626 m)   Wt 185 lb 6.4 oz (84.1 kg)   SpO2 99%   BMI 31.82 kg/m    Physical Exam Constitutional:      Comments: Tearful  HENT:     Head: Normocephalic and atraumatic.     Nose: Congestion present.  Eyes:     Pupils: Pupils are equal, round, and reactive to light.  Cardiovascular:     Rate and Rhythm: Normal rate and regular rhythm.     Pulses: Normal pulses.     Heart sounds: Normal heart sounds.  Pulmonary:     Effort: Pulmonary effort is  normal.     Breath sounds: Normal breath sounds. No wheezing, rhonchi or rales.  Abdominal:     General: Abdomen is flat. Bowel sounds are normal.     Palpations: Abdomen is soft.  Neurological:     General: No focal deficit present.     Mental Status: She is alert.     Cranial Nerves: No cranial nerve deficit or facial asymmetry.     Sensory: Sensation is intact.     Motor: No weakness or tremor.     Gait: Gait is intact.     Deep Tendon Reflexes: Reflexes are normal and symmetric.      ASSESSMENT/PLAN:   Left-sided back pain Patient has left-sided back pain with radiating intermittent pain to left face left arm and left lower extremity since having a fall at work.  She was seen in the ED after fall and was not found to have any abnormalities on imaging.  She was not getting better so was seen by [facility] who believe there is a component of degenerative disc disease in her cervical spine.  She saw them recently and they are redoing CT  imaging of cervical and lumbar spine given patient's ongoing symptoms.  She is trialed prednisone taper as well as PT for this.  She is frustrated with the lack of improvement but understands and agrees that her orthopedic doctor is the best person to manage this for her.  Provided emotional support as patient seems more frustrated about the lack of answers and lack of improvement rather than concerns about physical symptoms. - patient has follow up with [facility]  Cough Patient has a 2 to 3-day history of productive cough without fever or shortness of breath.  She was seen in the ED for similar episode and was diagnosed with bronchitis.  She got better and reports this feels similar to that.  On exam she is congested, lungs are clear to auscultation bilaterally, no wheezing or rails heard.  As she is afebrile without focal lung findings, likely bronchitis.  Discussed that this is only second episode but if this is recurrent she may follow-up with  Dr. Raymondo Band for PFTs. - zyrtec 10 mg - Coricidin cough syrup   Healthcare Maintenance Patient is due for yearly physical.  Patient would like to schedule physical with primary care provider (Dr. Sherrilee Gilles).  Patient to schedule at checkout.  Hal Morales, MD [facility] [place]

## 2022-11-14 NOTE — Assessment & Plan Note (Signed)
Patient has left-sided back pain with radiating intermittent pain to left face left arm and left lower extremity since having a fall at work.  She was seen in the ED after fall and was not found to have any abnormalities on imaging.  She was not getting better so was seen by [facility] who believe there is a component of degenerative disc disease in her cervical spine.  She saw them recently and they are redoing CT imaging of cervical and lumbar spine given patient's ongoing symptoms.  She is trialed prednisone taper as well as PT for this.  She is frustrated with the lack of improvement but understands and agrees that her orthopedic doctor is the best person to manage this for her.  Provided emotional support as patient seems more frustrated about the lack of answers and lack of improvement rather than concerns about physical symptoms. - patient has follow up with [facility]

## 2022-11-14 NOTE — Assessment & Plan Note (Signed)
Patient has a 2 to 3-day history of productive cough without fever or shortness of breath.  She was seen in the ED for similar episode and was diagnosed with bronchitis.  She got better and reports this feels similar to that.  On exam she is congested, lungs are clear to auscultation bilaterally, no wheezing or rails heard.  As she is afebrile without focal lung findings, likely bronchitis.  Discussed that this is only second episode but if this is recurrent she may follow-up with Dr. Raymondo Band for PFTs. - zyrtec 10 mg - Coricidin cough syrup

## 2022-11-14 NOTE — Patient Instructions (Addendum)
It was wonderful to see you today.  Please bring ALL of your medications with you to every visit.   Today we talked about:  Your cough- I have sent some medicine to your pharmacy for the cough. Take these as needed for a few days.   I would also try Coricidin cough syrup for the cough.   I also believe there is a component of allergic rhinitis to this cough given your congestion. I have sent allergy medicine to your pharmacy.   Your back pain is being managed by your orthopaedic doctor. Please continue to follow their recommendations.   Please schedule a yearly physical with your primary care provider, Dr. Lincoln Brigham   Thank you for choosing [facility].   Please call [phone] with any questions about today's appointment.  Please arrive at least 15 minutes prior to your scheduled appointments.   If you had blood work today, I will send you a MyChart message or a letter if results are normal. Otherwise, I will give you a call.   If you had a referral placed, they will call you to set up an appointment. Please give [place] a call if you don't hear back in the next 2 weeks.   If you need additional refills before your next appointment, please call your pharmacy first.   Hal Morales, MD Family Medicine

## 2022-12-12 ENCOUNTER — Ambulatory Visit: Payer: Medicare PPO | Admitting: Family Medicine

## 2022-12-12 ENCOUNTER — Encounter: Payer: Self-pay | Admitting: Family Medicine

## 2022-12-12 VITALS — BP 125/65 | HR 71 | Ht 64.0 in | Wt 183.8 lb

## 2022-12-12 DIAGNOSIS — I5022 Chronic systolic (congestive) heart failure: Secondary | ICD-10-CM

## 2022-12-12 DIAGNOSIS — R7303 Prediabetes: Secondary | ICD-10-CM

## 2022-12-12 LAB — POCT GLYCOSYLATED HEMOGLOBIN (HGB A1C): HbA1c, POC (prediabetic range): 6.3 % (ref 5.7–6.4)

## 2022-12-12 NOTE — Patient Instructions (Signed)
Good to see you today - Thank you for coming in  Things we discussed today:  1) lets get an echocardiogram to check on the health of your heart.  We will call you with the date once we have it scheduled.  2) we will also send you to see a cardiologist.  They are heart doctor specialists.  Expect a phone call from them in the next few days to help schedule an appointment.  3) we will check your A1c today to look for your blood sugar.  We are checking to see if you have diabetes.  Please always bring your medication bottles  Come back to see me in February to discuss getting a mammogram, colonoscopy, and DEXA scan. We will also check in on other medical concerns.

## 2022-12-12 NOTE — Progress Notes (Cosign Needed)
    SUBJECTIVE:   Julie Ware is a 67 y.o. who presents today for follow-up with PCP.   Hx of CHF, hyperthyroid, prediabetes,  Hip pain - working with Raechel Chute for his hip pain. Also seeing PT. They are considering doing steroid injections.  Meds - Is taking a medication for pain as well that she got prescribed. Unsure what is is. Also take ibuprofen for pain OTC.   CHF - discussed getting updated echo. She was unaware that she had CHF or mitral regurg.   Social Hx - Does not smoke or use alcohol. No other drugs.    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  - Feels like she can't handle too many appointments at this time. - Wants to wait to get mammogram, colonoscopy, and DEXA.   OBJECTIVE:   BP 125/65   Pulse 71   Ht 5\' 4"  (1.626 m)   Wt 183 lb 12.8 oz (83.4 kg)   SpO2 99%   BMI 31.55 kg/m   General: Alert, pleasant woman. NAD. HEENT: NCAT. MMM. CV: RRR, no murmurs. Resp: CTAB, no wheezing or crackles. Normal WOB on RA.  Abm: Soft, nontender, nondistended. BS present. Ext: Moves all ext spontaneously. Trace edema in L LE. No edema in R LE.  Skin: Warm, well perfused   ASSESSMENT/PLAN:   Prediabetes Repeat A1c today wnl. Encouraged lifestyle modifications   Assessment & Plan Chronic systolic congestive heart failure (HCC) Per 08/27/2013 Cardiology note, pt has hx of EF 35-40% and was previously on lisinopril and metop. Pt o longer on either of these meds and is not aware of her heart failure or hx of MR. She is no longer following with cardiology. Minimal edema on exam and VSS. Denies dyspnea. - will get updated echo - will send referral for Cards for pt to reestablish care Prediabetes Repeat A1c today wnl. Encouraged lifestyle modifications   F/u in Feb. - discuss colonoscopy, DEXA, and mammo at this time   Lincoln Brigham, MD [facility]

## 2022-12-14 NOTE — Assessment & Plan Note (Signed)
Repeat A1c today wnl. Encouraged lifestyle modifications

## 2023-02-15 NOTE — Progress Notes (Deleted)
 CARDIOLOGY CONSULT NOTE       Patient ID: Julie Ware MRN: [id number] DOB/AGE: [birth year] 66 y.o.  Admit date: (Not on file) Referring Physician: Sherrilee Gilles Primary Physician: Lincoln Brigham, MD Primary Cardiologist: New Reason for Consultation: CHF  Active Problems:   * No active hospital problems. *   HPI:  68 y.o. referred by Dr Sherrilee Gilles for CHF. History of graves disease, anxiety and anemia with thrombocytopenia. Echo as far back as 06/18/13 showed EF 35-40% with moderate AR/MR. There was also moderate RT/PR. Seen by Dr Mayford Knife at that time updated TTE 09/01/13 showed EF 45-50% with continued moderate AR/MR. Chronic hip pain seeing [facility]. At initial time of diagnosis was very hyperthyroid with Free T3 > 20 Some Rx with Methemizole Started on ACE Last TSH I see from 10/25/21 showed  TSH 0.534 T4 7.1 .  When seen by primary had little insight into prior diagnosis of DCM and not on any heart meds  ***  ROS All other systems reviewed and negative except as noted above  Past Medical History:  Diagnosis Date   Anxiety    Graves disease 01/2013   Microcytic anemia 09/2011   Thrombocytopenia (HCC) 06/2011    Family History  Problem Relation Age of Onset   Diabetes Mother    Hypertension Mother    Hyperlipidemia Mother    Hyperlipidemia Father    Hypertension Father    Diabetes Father    Hypertension Paternal Grandmother    Diabetes Paternal Grandmother    Hypertension Maternal Grandfather    Diabetes Maternal Grandfather     Social History   Socioeconomic History   Marital status: Legally Separated    Spouse name: Not on file   Number of children: Not on file   Years of education: Not on file   Highest education level: Not on file  Occupational History   Not on file  Tobacco Use   Smoking status: Never   Smokeless tobacco: Never  Substance and Sexual Activity   Alcohol use: No   Drug use: No   Sexual activity: Yes    Birth control/protection: None  Other Topics  Concern   Not on file  Social History Narrative   Not on file   Social Drivers of Health   Financial Resource Strain: Not on file  Food Insecurity: Not on file  Transportation Needs: Not on file  Physical Activity: Not on file  Stress: Not on file  Social Connections: Unknown (05/22/2021)   Received from [facility]   Social Network    Social Network: Not on file  Intimate Partner Violence: Not At Risk (09/28/2022)   Received from [facility]   HITS    Over the last 12 months how often did your partner physically hurt you?: Never    Over the last 12 months how often did your partner insult you or talk down to you?: Never    Over the last 12 months how often did your partner threaten you with physical harm?: Never    Over the last 12 months how often did your partner scream or curse at you?: Never    Past Surgical History:  Procedure Laterality Date   CESAREAN SECTION     X 3 (1978, 1980, 1992)   ENDOMETRIAL BIOPSY  2003   benign pathology.    TUBAL LIGATION  1992      Current Outpatient Medications:    benzonatate (TESSALON) 100 MG capsule, Take 1 capsule (100 mg total) by  mouth 2 (two) times daily as needed for cough., Disp: 30 capsule, Rfl: 0   cetirizine (ZYRTEC ALLERGY) 10 MG tablet, Take 1 tablet (10 mg total) by mouth daily., Disp: 30 tablet, Rfl: 0   tetrahydrozoline 0.05 % ophthalmic solution, Place 2 drops into both eyes 3 (three) times daily., Disp: , Rfl:     Physical Exam: There were no vitals taken for this visit.    Affect appropriate Healthy:  appears stated age HEENT: normal Neck *** JVP normal no bruits   Lungs clear with no wheezing and good diaphragmatic motion Heart:  S1/S2 no murmur, no rub, gallop or click PMI normal Abdomen: benighn, BS positve, no tenderness, no AAA no bruit.  No HSM or HJR Distal pulses intact with no bruits No edema Neuro non-focal Skin warm and dry No muscular weakness   Labs:   Lab Results   Component Value Date   WBC 5.4 10/25/2021   HGB 13.8 10/25/2021   HCT 41.8 10/25/2021   MCV 80 10/25/2021   PLT 198 10/25/2021   No results for input(s): "NA", "K", "CL", "CO2", "BUN", "CREATININE", "CALCIUM", "PROT", "BILITOT", "ALKPHOS", "ALT", "AST", "GLUCOSE" in the last 168 hours.  Invalid input(s): "LABALBU" Lab Results  Component Value Date   CKTOTAL 27 06/18/2013   CKMB 1.0 06/18/2013   TROPONINI <0.30 06/23/2013    Lab Results  Component Value Date   CHOL 195 03/19/2018   CHOL 112 06/20/2013   Lab Results  Component Value Date   HDL 66 03/19/2018   HDL 41 06/20/2013   Lab Results  Component Value Date   LDLCALC 113 (H) 03/19/2018   LDLCALC 61 06/20/2013   Lab Results  Component Value Date   TRIG 78 03/19/2018   TRIG 50 06/20/2013   Lab Results  Component Value Date   CHOLHDL 3.0 03/19/2018   CHOLHDL 2.7 06/20/2013   No results found for: "LDLDIRECT"    Radiology: No results found.  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 Needs updated TTE. If EF still moderately reduced will need intensive GDMT including entresto and coreg to start Suspect due to hyperthyroidism.  Hyperthyroidism. Check TSH, T4 and free T3 ***  TTE  F/U after echo for medical management of likely persistent systolic LV dysfunction  Signed: Charlton Haws 02/15/2023, 5:54 PM

## 2023-02-25 ENCOUNTER — Ambulatory Visit: Payer: Self-pay | Admitting: Family Medicine

## 2023-02-25 NOTE — Progress Notes (Deleted)
    SUBJECTIVE:   CHIEF COMPLAINT / HPI:   ***  SOB? Chest pain?    Health Maintenance:  Colonoscopy, mammogram, DEXA  Cardiology  PERTINENT  PMH / PSH: ***  OBJECTIVE:   There were no vitals taken for this visit.  ***  ASSESSMENT/PLAN:   No problem-specific Assessment & Plan notes found for this encounter.     Lincoln Brigham, MD [facility]

## 2023-02-27 ENCOUNTER — Ambulatory Visit: Payer: Medicare PPO | Attending: Cardiovascular Disease | Admitting: Cardiovascular Disease

## 2023-04-10 DIAGNOSIS — Z886 Allergy status to analgesic agent status: Secondary | ICD-10-CM | POA: Diagnosis not present

## 2023-04-10 DIAGNOSIS — S0990XA Unspecified injury of head, initial encounter: Secondary | ICD-10-CM | POA: Diagnosis not present

## 2023-04-10 DIAGNOSIS — S0993XA Unspecified injury of face, initial encounter: Secondary | ICD-10-CM | POA: Diagnosis not present

## 2023-04-10 DIAGNOSIS — K029 Dental caries, unspecified: Secondary | ICD-10-CM | POA: Diagnosis not present

## 2023-04-10 DIAGNOSIS — S0083XA Contusion of other part of head, initial encounter: Secondary | ICD-10-CM | POA: Diagnosis not present

## 2023-04-10 DIAGNOSIS — W010XXA Fall on same level from slipping, tripping and stumbling without subsequent striking against object, initial encounter: Secondary | ICD-10-CM | POA: Diagnosis not present

## 2023-05-01 DIAGNOSIS — G44009 Cluster headache syndrome, unspecified, not intractable: Secondary | ICD-10-CM | POA: Diagnosis not present

## 2023-05-01 DIAGNOSIS — K029 Dental caries, unspecified: Secondary | ICD-10-CM | POA: Diagnosis not present

## 2023-05-01 DIAGNOSIS — R030 Elevated blood-pressure reading, without diagnosis of hypertension: Secondary | ICD-10-CM | POA: Diagnosis not present

## 2023-06-11 ENCOUNTER — Ambulatory Visit
Admission: RE | Admit: 2023-06-11 | Discharge: 2023-06-11 | Disposition: A | Discharge status: Home / Self Care | Source: Ambulatory Visit | Attending: Family Medicine | Admitting: Family Medicine

## 2023-06-11 ENCOUNTER — Ambulatory Visit (INDEPENDENT_AMBULATORY_CARE_PROVIDER_SITE_OTHER): Admitting: Student

## 2023-06-11 VITALS — BP 152/78 | HR 82 | Wt 183.8 lb

## 2023-06-11 DIAGNOSIS — R059 Cough, unspecified: Secondary | ICD-10-CM

## 2023-06-11 DIAGNOSIS — R058 Other specified cough: Secondary | ICD-10-CM | POA: Insufficient documentation

## 2023-06-11 DIAGNOSIS — Z1231 Encounter for screening mammogram for malignant neoplasm of breast: Secondary | ICD-10-CM | POA: Diagnosis not present

## 2023-06-11 DIAGNOSIS — I5022 Chronic systolic (congestive) heart failure: Secondary | ICD-10-CM

## 2023-06-11 DIAGNOSIS — I272 Pulmonary hypertension, unspecified: Secondary | ICD-10-CM | POA: Diagnosis not present

## 2023-06-11 NOTE — Assessment & Plan Note (Signed)
 For 3 weeks. Obtain chest x ray given hx pulmonary HTN, CHF- would like to evaluate for edema and/or infectious process such as pneumonia. Reassuringly, afebrile with normal SpO2 on room air and clear lung sounds. If negative x ray, consider spirometry to evaluate for underlying lung disease such as COPD Echocardiogram ordered as well

## 2023-06-11 NOTE — Progress Notes (Signed)
    SUBJECTIVE:   CHIEF COMPLAINT / HPI:   Cough Cough x3 weeks; white mucous Fatigued with body aches Afebrile Gets short of breath with walking No abdominal pain, nausea, vomiting, diarrhea No known sick contacts Never smoker   Per chart review, completed in 2015, LVEF 45-50%, moderate aortic regurgitation and showed increased RV pressure consistent with mild pulmonary hypertension   HCM: Desires mammography (has never completed before). Also due for colonoscopy.  PERTINENT  PMH / PSH: Hx systolic CHF  OBJECTIVE:   BP (!) 152/78   Pulse 82   Wt 183 lb 12.8 oz (83.4 kg)   SpO2 98%   BMI 31.55 kg/m   General: NAD, well appearing Cardiac: RRR Neuro: A&O Respiratory: Dry cough noted. normal WOB on RA. No wheezing or crackles on auscultation, good lung sounds throughout Extremities: Moving all 4 extremities equally  ASSESSMENT/PLAN:   Pulmonary HTN Needs f/u with cardiology. Referral re-placed, discussed with patient to keep phone turned on  Cough with sputum For 3 weeks. Obtain chest x ray given hx pulmonary HTN, CHF- would like to evaluate for edema and/or infectious process such as pneumonia. Reassuringly, afebrile with normal SpO2 on room air and clear lung sounds. If negative x ray, consider spirometry to evaluate for underlying lung disease such as COPD Echocardiogram ordered as well     Julie Holquin, DO [facility]

## 2023-06-11 NOTE — Assessment & Plan Note (Signed)
 Needs f/u with cardiology. Referral re-placed, discussed with patient to keep phone turned on

## 2023-06-11 NOTE — Patient Instructions (Signed)
 It was great seeing you today.  As we discussed, - You will be called to schedule your echocardiogram (heart ultrasound), cardiology appointment, and colonoscopy. This may take 3-4 weeks. - Please go to [address] to get your chest x-ray completed. This is a walk-in, no appointment necessary.    Dr. Vallorie Gayer [facility] [phone]

## 2023-06-12 DIAGNOSIS — J069 Acute upper respiratory infection, unspecified: Secondary | ICD-10-CM | POA: Diagnosis not present

## 2023-06-12 DIAGNOSIS — I517 Cardiomegaly: Secondary | ICD-10-CM | POA: Diagnosis not present

## 2023-06-12 DIAGNOSIS — I420 Dilated cardiomyopathy: Secondary | ICD-10-CM | POA: Diagnosis not present

## 2023-06-12 DIAGNOSIS — I272 Pulmonary hypertension, unspecified: Secondary | ICD-10-CM | POA: Diagnosis not present

## 2023-06-12 DIAGNOSIS — I340 Nonrheumatic mitral (valve) insufficiency: Secondary | ICD-10-CM | POA: Diagnosis not present

## 2023-06-12 DIAGNOSIS — E669 Obesity, unspecified: Secondary | ICD-10-CM | POA: Diagnosis not present

## 2023-06-12 DIAGNOSIS — E0500 Thyrotoxicosis with diffuse goiter without thyrotoxic crisis or storm: Secondary | ICD-10-CM | POA: Diagnosis not present

## 2023-06-12 DIAGNOSIS — I5020 Unspecified systolic (congestive) heart failure: Secondary | ICD-10-CM | POA: Diagnosis not present

## 2023-06-12 DIAGNOSIS — E0590 Thyrotoxicosis, unspecified without thyrotoxic crisis or storm: Secondary | ICD-10-CM | POA: Diagnosis not present

## 2023-07-03 DIAGNOSIS — Z6831 Body mass index (BMI) 31.0-31.9, adult: Secondary | ICD-10-CM | POA: Diagnosis not present

## 2023-07-03 DIAGNOSIS — I5020 Unspecified systolic (congestive) heart failure: Secondary | ICD-10-CM | POA: Diagnosis not present

## 2023-07-03 DIAGNOSIS — Z1159 Encounter for screening for other viral diseases: Secondary | ICD-10-CM | POA: Diagnosis not present

## 2023-07-03 DIAGNOSIS — Z79899 Other long term (current) drug therapy: Secondary | ICD-10-CM | POA: Diagnosis not present

## 2023-07-03 DIAGNOSIS — F320 Major depressive disorder, single episode, mild: Secondary | ICD-10-CM | POA: Diagnosis not present

## 2023-07-03 DIAGNOSIS — Z0001 Encounter for general adult medical examination with abnormal findings: Secondary | ICD-10-CM | POA: Diagnosis not present

## 2023-07-03 DIAGNOSIS — E559 Vitamin D deficiency, unspecified: Secondary | ICD-10-CM | POA: Diagnosis not present

## 2023-07-03 DIAGNOSIS — E669 Obesity, unspecified: Secondary | ICD-10-CM | POA: Diagnosis not present

## 2023-07-03 DIAGNOSIS — M62838 Other muscle spasm: Secondary | ICD-10-CM | POA: Diagnosis not present

## 2023-07-03 DIAGNOSIS — R519 Headache, unspecified: Secondary | ICD-10-CM | POA: Diagnosis not present

## 2023-07-03 DIAGNOSIS — I420 Dilated cardiomyopathy: Secondary | ICD-10-CM | POA: Diagnosis not present

## 2023-07-03 DIAGNOSIS — R0602 Shortness of breath: Secondary | ICD-10-CM | POA: Diagnosis not present

## 2023-07-03 DIAGNOSIS — R079 Chest pain, unspecified: Secondary | ICD-10-CM | POA: Diagnosis not present

## 2023-12-12 ENCOUNTER — Ambulatory Visit
